# Patient Record
Sex: Male | Born: 1965
Health system: Southern US, Community
[De-identification: ages and names within clinical notes are randomized; demographics above are authoritative.]

## PROBLEM LIST (undated history)

## (undated) DIAGNOSIS — N200 Calculus of kidney: Secondary | ICD-10-CM

## (undated) DIAGNOSIS — J329 Chronic sinusitis, unspecified: Secondary | ICD-10-CM

---

## 2010-12-15 ENCOUNTER — Emergency Department: Payer: Self-pay | Admitting: Emergency Medicine

## 2011-03-02 ENCOUNTER — Emergency Department: Payer: Self-pay | Admitting: Internal Medicine

## 2013-01-15 ENCOUNTER — Ambulatory Visit: Payer: Self-pay | Admitting: Family Medicine

## 2015-03-05 ENCOUNTER — Other Ambulatory Visit: Payer: Self-pay | Admitting: Unknown Physician Specialty

## 2015-03-05 DIAGNOSIS — G44321 Chronic post-traumatic headache, intractable: Secondary | ICD-10-CM

## 2015-03-06 ENCOUNTER — Ambulatory Visit: Payer: Self-pay

## 2015-03-11 ENCOUNTER — Ambulatory Visit
Admission: RE | Admit: 2015-03-11 | Discharge: 2015-03-11 | Disposition: A | Discharge status: Home / Self Care | Payer: BLUE CROSS/BLUE SHIELD | Source: Ambulatory Visit | Attending: Unknown Physician Specialty | Admitting: Unknown Physician Specialty

## 2015-03-11 DIAGNOSIS — G44321 Chronic post-traumatic headache, intractable: Secondary | ICD-10-CM | POA: Insufficient documentation

## 2015-03-14 ENCOUNTER — Emergency Department
Admission: EM | Admit: 2015-03-14 | Discharge: 2015-03-15 | Disposition: A | Discharge status: Home / Self Care | Payer: BLUE CROSS/BLUE SHIELD | Attending: Emergency Medicine | Admitting: Emergency Medicine

## 2015-03-14 DIAGNOSIS — Z79899 Other long term (current) drug therapy: Secondary | ICD-10-CM | POA: Diagnosis not present

## 2015-03-14 DIAGNOSIS — N200 Calculus of kidney: Secondary | ICD-10-CM | POA: Diagnosis not present

## 2015-03-14 DIAGNOSIS — R109 Unspecified abdominal pain: Secondary | ICD-10-CM | POA: Diagnosis present

## 2015-03-14 LAB — BASIC METABOLIC PANEL
Anion gap: 11 (ref 5–15)
BUN: 16 mg/dL (ref 6–20)
CO2: 21 mmol/L — ABNORMAL LOW (ref 22–32)
Calcium: 8.7 mg/dL — ABNORMAL LOW (ref 8.9–10.3)
Chloride: 107 mmol/L (ref 101–111)
Creatinine, Ser: 1.29 mg/dL — ABNORMAL HIGH (ref 0.61–1.24)
GFR calc Af Amer: >60 mL/min (ref >60)
Glucose, Bld: 112 mg/dL — ABNORMAL HIGH (ref 65–99)
Potassium: 3.5 mmol/L (ref 3.5–5.1)
Sodium: 139 mmol/L (ref 135–145)

## 2015-03-14 LAB — CBC
HCT: 46.8 % (ref 40.0–52.0)
Hemoglobin: 15.6 g/dL (ref 13.0–18.0)
MCH: 30 pg (ref 26.0–34.0)
MCHC: 33.3 g/dL (ref 32.0–36.0)
MCV: 90 fL (ref 80.0–100.0)
Platelets: 179 10*3/uL (ref 150–440)
RBC: 5.2 MIL/uL (ref 4.40–5.90)
RDW: 13 % (ref 11.5–14.5)
WBC: 8.1 10*3/uL (ref 3.8–10.6)

## 2015-03-14 MED ORDER — SODIUM CHLORIDE 0.9 % IV SOLN: 1000.0000 mL | INTRAVENOUS | Status: DC

## 2015-03-14 MED ORDER — MORPHINE SULFATE 4 MG/ML IJ SOLN
INTRAMUSCULAR | Status: AC
  Administered 2015-03-14: 4 mg via INTRAVENOUS
  Filled 2015-03-14: qty 1

## 2015-03-14 MED ORDER — MORPHINE SULFATE 4 MG/ML IJ SOLN
4.0000 mg | Freq: Once | INTRAMUSCULAR | Status: AC
  Administered 2015-03-14: 4 mg via INTRAVENOUS

## 2015-03-14 MED ORDER — ONDANSETRON HCL 4 MG/2ML IJ SOLN
4.0000 mg | Freq: Once | INTRAMUSCULAR | Status: AC
  Administered 2015-03-14: 4 mg via INTRAVENOUS

## 2015-03-14 MED ORDER — SODIUM CHLORIDE 0.9 % IV SOLN
1000.0000 mL | Freq: Once | INTRAVENOUS | Status: AC
  Administered 2015-03-14: 1000 mL via INTRAVENOUS

## 2015-03-14 MED ORDER — ONDANSETRON HCL 4 MG/2ML IJ SOLN
INTRAMUSCULAR | Status: AC
  Administered 2015-03-14: 4 mg via INTRAVENOUS
  Filled 2015-03-14: qty 2

## 2015-03-14 NOTE — ED Notes (Signed)
Patient to ED with c/o left flank pain, started about 2 hours ago, history of same about a year and a half ago.

## 2015-03-15 ENCOUNTER — Emergency Department: Payer: BLUE CROSS/BLUE SHIELD

## 2015-03-15 LAB — URINALYSIS COMPLETE WITH MICROSCOPIC (ARMC ONLY)
BACTERIA UA: NONE SEEN
Bilirubin Urine: NEGATIVE
GLUCOSE, UA: NEGATIVE mg/dL
Leukocytes, UA: NEGATIVE
Nitrite: NEGATIVE
Protein, ur: NEGATIVE mg/dL
SPECIFIC GRAVITY, URINE: 1.011 (ref 1.005–1.030)
pH: 5 (ref 5.0–8.0)

## 2015-03-15 MED ORDER — TAMSULOSIN HCL 0.4 MG PO CAPS
0.4000 mg | ORAL_CAPSULE | Freq: Once | ORAL | Status: AC
  Administered 2015-03-15: 0.4 mg via ORAL

## 2015-03-15 MED ORDER — TAMSULOSIN HCL 0.4 MG PO CAPS
ORAL_CAPSULE | ORAL | Status: AC
  Administered 2015-03-15: 0.4 mg via ORAL
  Filled 2015-03-15: qty 1

## 2015-03-15 MED ORDER — OXYCODONE-ACETAMINOPHEN 5-325 MG PO TABS: 1.0000 | ORAL_TABLET | Freq: Four times a day (QID) | ORAL | Status: DC | PRN

## 2015-03-15 MED ORDER — TAMSULOSIN HCL 0.4 MG PO CAPS: 0.4000 mg | ORAL_CAPSULE | Freq: Every day | ORAL | Status: DC

## 2015-03-15 NOTE — Discharge Instructions (Signed)

## 2015-03-15 NOTE — ED Provider Notes (Signed)
[facility] Emergency Department Provider Note  ____________________________________________  Time seen: Approximately 11:45 PM  I have reviewed the triage vital signs and the nursing notes.   HISTORY  Chief Complaint Flank Pain    HPI Jackson Matthews is a 49 y.o. male with a history of kidney zones who presents with sudden onset pain 2 hours ago in his left lumbar region. Has associated nausea with dry heaves. No bloody urine. No pain in the penis or testicles. Says feels exactly like past kidney stones pain was 10 out of 10 and sharp. Pain now improved and feeling relaxed with morphine.No burning with urination. No diarrhea. Has never required surgical or procedural treatment of kidney stones in the past.   Past history of kidney stones  There are no active problems to display for this patient.   No past surgical history on file.  Current Outpatient Rx  Name  Route  Sig  Dispense  Refill  . montelukast (SINGULAIR) 10 MG tablet   Oral   Take 10 mg by mouth daily.           Allergies Review of patient's allergies indicates no known allergies.  No family history on file.  Social History History  Substance Use Topics  . Smoking status: Not on file  . Smokeless tobacco: Not on file  . Alcohol Use: Not on file    Review of Systems Constitutional: No fever/chills Eyes: No visual changes. ENT: No sore throat. Cardiovascular: Denies chest pain. Respiratory: Denies shortness of breath. Gastrointestinal: No abdominal pain.  No diarrhea.  No constipation. Genitourinary: Negative for dysuria. Musculoskeletal: As above  Skin: Negative for rash. Neurological: Negative for headaches, focal weakness or numbness.  10-point ROS otherwise negative.  ____________________________________________   PHYSICAL EXAM:  VITAL SIGNS: ED Triage Vitals  Enc Vitals Group     BP 03/14/15 2304 114/88 mmHg     Pulse Rate 03/14/15 2304 66     Resp  03/14/15 2304 22     Temp 03/14/15 2304 97.8 F (36.6 C)     Temp Source 03/14/15 2304 Oral     SpO2 03/14/15 2304 97 %     Weight 03/14/15 2304 220 lb (99.791 kg)     Height 03/14/15 2304 6' (1.829 m)     Head Cir --      Peak Flow --      Pain Score 03/14/15 2304 10     Pain Loc --      Pain Edu? --      Excl. in GC? --     Constitutional: Alert and oriented. Well appearing and in no acute distress. Eyes: Conjunctivae are normal. PERRL. EOMI. Head: Atraumatic. Nose: No congestion/rhinnorhea. Mouth/Throat: Mucous membranes are moist.  Oropharynx non-erythematous. Neck: No stridor.   Cardiovascular: Normal rate, regular rhythm. Grossly normal heart sounds.  Good peripheral circulation. Respiratory: Normal respiratory effort.  No retractions. Lungs CTAB. Gastrointestinal: Soft and nontender. No distention. No abdominal bruits. No CVA tenderness. Musculoskeletal: No lower extremity tenderness nor edema.  No joint effusions. Neurologic:  Normal speech and language. No gross focal neurologic deficits are appreciated. Speech is normal. No gait instability. Skin:  Skin is warm, dry and intact. No rash noted. Psychiatric: Mood and affect are normal. Speech and behavior are normal.  ____________________________________________   LABS (all labs ordered are listed, but only abnormal results are displayed)  Labs Reviewed  BASIC METABOLIC PANEL - Abnormal; Notable for the following:    CO2 21 (*)  Glucose, Bld 112 (*)    Creatinine, Ser 1.29 (*)    Calcium 8.7 (*)    All other components within normal limits  URINALYSIS COMPLETEWITH MICROSCOPIC ([facility] ONLY) - Abnormal; Notable for the following:    Color, Urine YELLOW (*)    APPearance CLEAR (*)    Ketones, ur TRACE (*)    Hgb urine dipstick 3+ (*)    Squamous Epithelial / LPF 0-5 (*)    All other components within normal limits  CBC    ____________________________________________  EKG   ____________________________________________  RADIOLOGY  No hydronephrosis. Chronic left greater than right benign renal cysts. ____________________________________________   PROCEDURES    ____________________________________________   INITIAL IMPRESSION / ASSESSMENT AND PLAN / ED COURSE  Pertinent labs & imaging results that were available during my care of the patient were reviewed by me and considered in my medical decision making (see chart for details).  Likely kidney stone  ----------------------------------------- 3:43 AM on 03/15/2015 -----------------------------------------  Patient resting comfortably after pain medication. We'll discharge to home with Flomax, Percocet and strainer. Follow-up with known urologist Dr. Artis Flock. No stone seen on imaging but presentation and urine consistent with this. ____________________________________________   FINAL CLINICAL IMPRESSION(S) / ED DIAGNOSES  Acute left-sided kidney stone. Initial visit.    Myrna Blazer, MD 03/15/15 [id number]

## 2015-03-15 NOTE — ED Notes (Signed)
Patient at US

## 2015-07-31 ENCOUNTER — Emergency Department
Admission: EM | Admit: 2015-07-31 | Discharge: 2015-07-31 | Disposition: A | Discharge status: Home / Self Care | Payer: Worker's Compensation | Attending: Emergency Medicine | Admitting: Emergency Medicine

## 2015-07-31 ENCOUNTER — Encounter: Payer: Self-pay | Admitting: Emergency Medicine

## 2015-07-31 ENCOUNTER — Emergency Department: Payer: Worker's Compensation

## 2015-07-31 DIAGNOSIS — Z79899 Other long term (current) drug therapy: Secondary | ICD-10-CM | POA: Insufficient documentation

## 2015-07-31 DIAGNOSIS — Y9289 Other specified places as the place of occurrence of the external cause: Secondary | ICD-10-CM | POA: Insufficient documentation

## 2015-07-31 DIAGNOSIS — Y9389 Activity, other specified: Secondary | ICD-10-CM | POA: Insufficient documentation

## 2015-07-31 DIAGNOSIS — R110 Nausea: Secondary | ICD-10-CM | POA: Diagnosis not present

## 2015-07-31 DIAGNOSIS — S62630A Displaced fracture of distal phalanx of right index finger, initial encounter for closed fracture: Secondary | ICD-10-CM | POA: Insufficient documentation

## 2015-07-31 DIAGNOSIS — S61200A Unspecified open wound of right index finger without damage to nail, initial encounter: Secondary | ICD-10-CM | POA: Insufficient documentation

## 2015-07-31 DIAGNOSIS — W231XXA Caught, crushed, jammed, or pinched between stationary objects, initial encounter: Secondary | ICD-10-CM | POA: Insufficient documentation

## 2015-07-31 DIAGNOSIS — Y990 Civilian activity done for income or pay: Secondary | ICD-10-CM | POA: Diagnosis not present

## 2015-07-31 DIAGNOSIS — S62600B Fracture of unspecified phalanx of right index finger, initial encounter for open fracture: Secondary | ICD-10-CM

## 2015-07-31 DIAGNOSIS — S6991XA Unspecified injury of right wrist, hand and finger(s), initial encounter: Secondary | ICD-10-CM | POA: Diagnosis present

## 2015-07-31 DIAGNOSIS — S61209A Unspecified open wound of unspecified finger without damage to nail, initial encounter: Secondary | ICD-10-CM

## 2015-07-31 MED ORDER — CEPHALEXIN 500 MG PO CAPS: 500.0000 mg | ORAL_CAPSULE | Freq: Four times a day (QID) | ORAL | Status: DC

## 2015-07-31 MED ORDER — BUPIVACAINE HCL 0.5 % IJ SOLN
50.0000 mL | Freq: Once | INTRAMUSCULAR | Status: DC
  Filled 2015-07-31: qty 50

## 2015-07-31 MED ORDER — CEPHALEXIN 500 MG PO CAPS
500.0000 mg | ORAL_CAPSULE | Freq: Once | ORAL | Status: AC
  Administered 2015-07-31: 500 mg via ORAL
  Filled 2015-07-31: qty 1

## 2015-07-31 MED ORDER — BUPIVACAINE HCL (PF) 0.5 % IJ SOLN
INTRAMUSCULAR | Status: AC
  Filled 2015-07-31: qty 30

## 2015-07-31 MED ORDER — OXYCODONE-ACETAMINOPHEN 5-325 MG PO TABS
1.0000 | ORAL_TABLET | Freq: Once | ORAL | Status: AC
  Administered 2015-07-31: 1 via ORAL
  Filled 2015-07-31: qty 1

## 2015-07-31 MED ORDER — OXYCODONE-ACETAMINOPHEN 5-325 MG PO TABS: 1.0000 | ORAL_TABLET | ORAL | Status: DC | PRN

## 2015-07-31 MED ORDER — ONDANSETRON 4 MG PO TBDP
4.0000 mg | ORAL_TABLET | Freq: Once | ORAL | Status: AC
Start: 2015-07-31 — End: 2015-07-31
  Administered 2015-07-31: 4 mg via ORAL
  Filled 2015-07-31: qty 1

## 2015-07-31 NOTE — ED Notes (Signed)
Pt reports a roller bounced off trailer and cut end of finger.

## 2015-07-31 NOTE — ED Notes (Signed)
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

## 2015-07-31 NOTE — Discharge Instructions (Signed)
Call Dr. Elenor LegatoHooten's office today for an appointment Monday. Elevate finger and keep it clean and dry. Percocet as needed for pain. Begin taking Keflex every 6 hours to prevent infection. Return to the emergency room over the weekend if any severe worsening of your condition.

## 2015-07-31 NOTE — ED Provider Notes (Signed)
[facility] Emergency Department Provider Note  ____________________________________________  Time seen: Approximately 9:09 AM  I have reviewed the triage vital signs and the nursing notes.   HISTORY  Chief Complaint Laceration   HPI ALLAN MINOTTI is a 49 y.o. male is here with injury to his right index finger at work. Patient states that he was unloading a truck when his index finger was caught between a piece of wood and metal. With pulling his finger out he saw blood. Patient states that it is been 4 years since his last tetanus booster. This accident occurred at work and his supervisor is here with him.Currently his pain is 10 out of 10 and is constant. He denies any allergies to medications. Patient is also having some nausea secondary to the pain.   History reviewed. No pertinent past medical history.  There are no active problems to display for this patient.   History reviewed. No pertinent past surgical history.  Current Outpatient Rx  Name  Route  Sig  Dispense  Refill  . cephALEXin (KEFLEX) 500 MG capsule   Oral   Take 1 capsule (500 mg total) by mouth 4 (four) times daily.   28 capsule   0   . montelukast (SINGULAIR) 10 MG tablet   Oral   Take 10 mg by mouth daily.         Marland Kitchen oxyCODONE-acetaminophen (PERCOCET) 5-325 MG tablet   Oral   Take 1-2 tablets by mouth every 4 (four) hours as needed for severe pain.   30 tablet   0   . tamsulosin (FLOMAX) 0.4 MG CAPS capsule   Oral   Take 1 capsule (0.4 mg total) by mouth daily.   30 capsule   0     Allergies Review of patient's allergies indicates no known allergies.  History reviewed. No pertinent family history.  Social History Social History  Substance Use Topics  . Smoking status: Never Smoker   . Smokeless tobacco: None  . Alcohol Use: Yes    Review of Systems Constitutional: No fever/chills Eyes: No visual changes. Cardiovascular: Denies chest pain. Respiratory:  Denies shortness of breath. Gastrointestinal:  Positive nausea, no vomiting.  Musculoskeletal: Negative for back pain. Skin: Negative for rash. Neurological: Negative for headaches, focal weakness or numbness.  10-point ROS otherwise negative.  ____________________________________________   PHYSICAL EXAM:  VITAL SIGNS: ED Triage Vitals  Enc Vitals Group     BP 07/31/15 0859 133/78 mmHg     Pulse Rate 07/31/15 0859 90     Resp 07/31/15 0859 18     Temp 07/31/15 0859 98.5 F (36.9 C)     Temp Source 07/31/15 0859 Oral     SpO2 07/31/15 0859 96 %     Weight 07/31/15 0859 213 lb (96.616 kg)     Height 07/31/15 0859 6' (1.829 m)     Head Cir --      Peak Flow --      Pain Score --      Pain Loc --      Pain Edu? --      Excl. in GC? --     Constitutional: Alert and oriented. Well appearing and in no acute distress. Eyes: Conjunctivae are normal. PERRL. EOMI. Head: Atraumatic. Nose: No congestion/rhinnorhea. Neck: No stridor.   Cardiovascular: Normal rate, regular rhythm. Grossly normal heart sounds.  Good peripheral circulation. Respiratory: Normal respiratory effort.  No retractions. Lungs CTAB. Gastrointestinal: Soft and nontender. No distention. Musculoskeletal:  Right  index finger. Distal partial skin amputation. Nail remains intact and no active bleeding was noted. There is moderate amount of skin tissue on the volar aspect of the distal index finger avulsed. Range of motion of the index finger is present for flexion and extension. Neurologic:  Normal speech and language. No gross focal neurologic deficits are appreciated. No gait instability. Skin:  Skin is warm, dry and intact. Skin avulsion as noted above. Psychiatric: Mood and affect are normal. Speech and behavior are normal.  ____________________________________________   LABS (all labs ordered are listed, but only abnormal results are displayed)  Labs Reviewed - No data to display RADIOLOGY  Right index  finger x-ray shows soft tissue laceration distal tip of the second digit with a underlying comminuted fracture of the tuft. ____________________________________________   PROCEDURES  Procedure(s) performed: Digital block with 0.5 Marcaine. Digit was soaked in half Betadine and half normal saline. A nonadhering dressing was placed. Metal splint was applied for protection.  Critical Care performed: No  ____________________________________________   INITIAL IMPRESSION / ASSESSMENT AND PLAN / ED COURSE  Pertinent labs & imaging results that were available during my care of the patient were reviewed by me and considered in my medical decision making (see chart for details).  Discussed with Dr. Ernest PineHooten the plan of care for this patient. Patient is to be seen on Monday he is to call the office and make an appointment. He was placed on Keflex 500 mg 4 times a day for 7 days along with prescription for Percocet one or 2 every 4 hours as needed for pain. Patient is keep the dressing clean and dry. He is return to the emergency room over the weekend if any urgent problems. ____________________________________________   FINAL CLINICAL IMPRESSION(S) / ED DIAGNOSES  Final diagnoses:  Fracture of phalanx of right index finger, open, initial encounter  Avulsion of skin of finger, initial encounter      Tommi RumpsRhonda L Summers, PA-C 07/31/15 1435  Phineas SemenGraydon Goodman, MD 07/31/15 1453

## 2015-07-31 NOTE — ED Notes (Signed)
Right index finger undressed   Avulsion/degloving noted to tip of finger

## 2015-08-05 ENCOUNTER — Encounter: Payer: Self-pay | Admitting: *Deleted

## 2015-08-05 ENCOUNTER — Ambulatory Visit
Admission: RE | Admit: 2015-08-05 | Discharge: 2015-08-05 | Disposition: A | Discharge status: Home / Self Care | Payer: Worker's Compensation | Source: Ambulatory Visit | Attending: Orthopedic Surgery | Admitting: Orthopedic Surgery

## 2015-08-05 ENCOUNTER — Encounter
Admission: RE | Disposition: A | Discharge status: Home / Self Care | Payer: Self-pay | Source: Ambulatory Visit | Attending: Orthopedic Surgery

## 2015-08-05 ENCOUNTER — Ambulatory Visit: Payer: Worker's Compensation | Admitting: Anesthesiology

## 2015-08-05 DIAGNOSIS — S68620A Partial traumatic transphalangeal amputation of right index finger, initial encounter: Secondary | ICD-10-CM | POA: Diagnosis not present

## 2015-08-05 DIAGNOSIS — W230XXA Caught, crushed, jammed, or pinched between moving objects, initial encounter: Secondary | ICD-10-CM | POA: Diagnosis not present

## 2015-08-05 DIAGNOSIS — Y999 Unspecified external cause status: Secondary | ICD-10-CM | POA: Diagnosis not present

## 2015-08-05 DIAGNOSIS — Y9389 Activity, other specified: Secondary | ICD-10-CM | POA: Insufficient documentation

## 2015-08-05 DIAGNOSIS — Y929 Unspecified place or not applicable: Secondary | ICD-10-CM | POA: Insufficient documentation

## 2015-08-05 DIAGNOSIS — Z79899 Other long term (current) drug therapy: Secondary | ICD-10-CM | POA: Diagnosis not present

## 2015-08-05 DIAGNOSIS — Z9852 Vasectomy status: Secondary | ICD-10-CM | POA: Diagnosis not present

## 2015-08-05 DIAGNOSIS — Y939 Activity, unspecified: Secondary | ICD-10-CM | POA: Insufficient documentation

## 2015-08-05 HISTORY — PX: EXCISION METACARPAL MASS: SHX6372

## 2015-08-05 SURGERY — EXCISION METACARPAL MASS
Anesthesia: General | Laterality: Right | Wound class: Clean

## 2015-08-05 MED ORDER — LIDOCAINE HCL (CARDIAC) 20 MG/ML IV SOLN
INTRAVENOUS | Status: DC | PRN
  Administered 2015-08-05: 100 mg via INTRAVENOUS

## 2015-08-05 MED ORDER — ONDANSETRON HCL 4 MG/2ML IJ SOLN: 4.0000 mg | Freq: Once | INTRAMUSCULAR | Status: DC | PRN

## 2015-08-05 MED ORDER — BUPIVACAINE HCL (PF) 0.5 % IJ SOLN
INTRAMUSCULAR | Status: AC
  Filled 2015-08-05: qty 30

## 2015-08-05 MED ORDER — FENTANYL CITRATE (PF) 100 MCG/2ML IJ SOLN
INTRAMUSCULAR | Status: DC | PRN
  Administered 2015-08-05 (×4): 25 ug via INTRAVENOUS

## 2015-08-05 MED ORDER — NEOMYCIN-POLYMYXIN B GU 40-200000 IR SOLN
Status: AC
  Filled 2015-08-05: qty 2

## 2015-08-05 MED ORDER — LACTATED RINGERS IV SOLN
INTRAVENOUS | Status: DC
  Administered 2015-08-05: 10:00:00 via INTRAVENOUS

## 2015-08-05 MED ORDER — FENTANYL CITRATE (PF) 100 MCG/2ML IJ SOLN: 25.0000 ug | INTRAMUSCULAR | Status: DC | PRN

## 2015-08-05 MED ORDER — MIDAZOLAM HCL 2 MG/2ML IJ SOLN
INTRAMUSCULAR | Status: DC | PRN
  Administered 2015-08-05: 2 mg via INTRAVENOUS

## 2015-08-05 MED ORDER — NEOMYCIN-POLYMYXIN B GU 40-200000 IR SOLN
Status: DC | PRN
  Administered 2015-08-05: 2 mL

## 2015-08-05 MED ORDER — BUPIVACAINE HCL (PF) 0.5 % IJ SOLN
INTRAMUSCULAR | Status: DC | PRN
  Administered 2015-08-05: 20 mL

## 2015-08-05 MED ORDER — CEFAZOLIN SODIUM-DEXTROSE 2-3 GM-% IV SOLR
INTRAVENOUS | Status: AC
  Administered 2015-08-05: 2 g via INTRAVENOUS
  Filled 2015-08-05: qty 50

## 2015-08-05 MED ORDER — PROPOFOL 10 MG/ML IV BOLUS
INTRAVENOUS | Status: DC | PRN
  Administered 2015-08-05: 200 mg via INTRAVENOUS

## 2015-08-05 SURGICAL SUPPLY — 20 items
BNDG ESMARK 4X12 TAN STRL LF (GAUZE/BANDAGES/DRESSINGS) ×3 IMPLANT
CANISTER SUCT 1200ML W/VALVE (MISCELLANEOUS) ×3 IMPLANT
GAUZE PETRO XEROFOAM 1X8 (MISCELLANEOUS) ×3 IMPLANT
GAUZE SPONGE 4X4 12PLY STRL (GAUZE/BANDAGES/DRESSINGS) ×3 IMPLANT
GAUZE STRETCH 2X75IN STRL (MISCELLANEOUS) ×3 IMPLANT
GLOVE BIOGEL PI IND STRL 9 (GLOVE) ×1 IMPLANT
GLOVE BIOGEL PI INDICATOR 9 (GLOVE) ×2
GLOVE SURG ORTHO 9.0 STRL STRW (GLOVE) ×3 IMPLANT
GOWN SPECIALTY ULTRA XL (MISCELLANEOUS) ×3 IMPLANT
GOWN STRL REUS W/ TWL LRG LVL3 (GOWN DISPOSABLE) ×1 IMPLANT
GOWN STRL REUS W/TWL LRG LVL3 (GOWN DISPOSABLE) ×2
KIT RM TURNOVER STRD PROC AR (KITS) ×3 IMPLANT
NEEDLE HYPO 25X1 1.5 SAFETY (NEEDLE) ×3 IMPLANT
NS IRRIG 500ML POUR BTL (IV SOLUTION) ×3 IMPLANT
PACK EXTREMITY ARMC (MISCELLANEOUS) ×3 IMPLANT
PAD GROUND ADULT SPLIT (MISCELLANEOUS) ×3 IMPLANT
STOCKINETTE STRL 6IN 960660 (GAUZE/BANDAGES/DRESSINGS) ×3 IMPLANT
SUT ETHILON 4-0 (SUTURE) ×2
SUT ETHILON 4-0 FS2 18XMFL BLK (SUTURE) ×1
SUTURE ETHLN 4-0 FS2 18XMF BLK (SUTURE) ×1 IMPLANT

## 2015-08-05 NOTE — Discharge Instructions (Signed)
Leave dressing in place until return visit.AMBULATORY SURGERY  DISCHARGE INSTRUCTIONS   1) The drugs that you were given will stay in your system until tomorrow so for the next 24 hours you should not:  A) Drive an automobile B) Make any legal decisions C) Drink any alcoholic beverage   2) You may resume regular meals tomorrow.  Today it is better to start with liquids and gradually work up to solid foods.  You may eat anything you prefer, but it is better to start with liquids, then soup and crackers, and gradually work up to solid foods.   3) Please notify your doctor immediately if you have any unusual bleeding, trouble breathing, redness and pain at the surgery site, drainage, fever, or pain not relieved by medication.    4) Additional Instructions:        Please contact your physician with any problems or Same Day Surgery at [phone], Monday through Friday 6 am to 4 pm, or  at [facility] Main number at [phone].

## 2015-08-05 NOTE — Anesthesia Procedure Notes (Signed)
Procedure Name: LMA Insertion Date/Time: 08/05/2015 11:41 AM Performed by: Junious SilkNOLES, Jalila Goodnough Pre-anesthesia Checklist: Patient identified, Patient being monitored, Timeout performed, Emergency Drugs available and Suction available Patient Re-evaluated:Patient Re-evaluated prior to inductionOxygen Delivery Method: Circle system utilized Preoxygenation: Pre-oxygenation with 100% oxygen Intubation Type: IV induction Ventilation: Mask ventilation without difficulty LMA: LMA inserted LMA Size: 4.5 Tube type: Oral Number of attempts: 1 Placement Confirmation: positive ETCO2 and breath sounds checked- equal and bilateral Tube secured with: Tape Dental Injury: Teeth and Oropharynx as per pre-operative assessment

## 2015-08-05 NOTE — Anesthesia Preprocedure Evaluation (Signed)
Anesthesia Evaluation  Patient identified by MRN, date of birth, ID band  Reviewed: Allergy & Precautions, NPO status , Patient's Chart, lab work & pertinent test results  History of Anesthesia Complications Negative for: history of anesthetic complications  Airway Mallampati: III       Dental  (+) Teeth Intact   Pulmonary neg pulmonary ROS,           Cardiovascular negative cardio ROS       Neuro/Psych negative neurological ROS     GI/Hepatic negative GI ROS, Neg liver ROS,   Endo/Other  negative endocrine ROS  Renal/GU negative Renal ROS     Musculoskeletal   Abdominal   Peds  Hematology negative hematology ROS (+)   Anesthesia Other Findings   Reproductive/Obstetrics                             Anesthesia Physical Anesthesia Plan  ASA: II  Anesthesia Plan: General   Post-op Pain Management:    Induction: Intravenous  Airway Management Planned: LMA  Additional Equipment:   Intra-op Plan:   Post-operative Plan:   Informed Consent: I have reviewed the patients History and Physical, chart, labs and discussed the procedure including the risks, benefits and alternatives for the proposed anesthesia with the patient or authorized representative who has indicated his/her understanding and acceptance.     Plan Discussed with:   Anesthesia Plan Comments:         Anesthesia Quick Evaluation

## 2015-08-05 NOTE — H&P (Signed)
Reviewed paper H+P, will be scanned into chart. No changes noted.  

## 2015-08-05 NOTE — Transfer of Care (Signed)
Immediate Anesthesia Transfer of Care Note  Patient: Cammie McgeeMilford R Rhee  Procedure(s) Performed: Procedure(s): revision right index finger amputation with closure (Right)  Patient Location: PACU  Anesthesia Type:General  Level of Consciousness: sedated  Airway & Oxygen Therapy: Patient Spontanous Breathing and Patient connected to face mask oxygen  Post-op Assessment: Report given to RN and Post -op Vital signs reviewed and stable  Post vital signs: Reviewed and stable  Last Vitals:  Filed Vitals:   08/05/15 1204  BP: 130/74  Pulse: 73  Temp: 36.1 C  Resp:     Complications: No apparent anesthesia complications

## 2015-08-05 NOTE — Anesthesia Postprocedure Evaluation (Signed)
  Anesthesia Post-op Note  Patient: Jackson Matthews  Procedure(s) Performed: Procedure(s): revision right index finger amputation with closure (Right)  Anesthesia type:General  Patient location: PACU  Post pain: Pain level controlled  Post assessment: Post-op Vital signs reviewed, Patient's Cardiovascular Status Stable, Respiratory Function Stable, Patent Airway and No signs of Nausea or vomiting  Post vital signs: Reviewed and stable  Last Vitals:  Filed Vitals:   08/05/15 1258  BP: 118/83  Pulse: 68  Temp:   Resp:     Level of consciousness: awake, alert  and patient cooperative  Complications: No apparent anesthesia complications

## 2015-08-05 NOTE — Op Note (Signed)
08/05/2015  12:08 PM  PATIENT:  Jackson Matthews  49 y.o. male  PRE-OPERATIVE DIAGNOSIS:  traumatic amputation right index finger  POST-OPERATIVE DIAGNOSIS:  traumatic amputation right index finger  PROCEDURE:  Procedure(s): revision right index finger amputation with closure (Right)  SURGEON: Leitha SchullerMichael J Debraann Livingstone, MD  ASSISTANTS: None  ANESTHESIA:   spinal  EBL:  Total I/O In: 500 [I.V.:500] Out: -   BLOOD ADMINISTERED:none  DRAINS: none   LOCAL MEDICATIONS USED:  MARCAINE     SPECIMEN:  No Specimen  DISPOSITION OF SPECIMEN:  N/A  COUNTS:  YES  TOURNIQUET:  5 minutes at 2 50 mmHg  IMPLANTS: None  DICTATION: .Dragon Dictation patient brought the operating room and after adequate anesthesia was obtained, the right arm was prepped and draped in sterile fashion. After patient identification and timeout procedures were completed tourniquet was raised to her 50 murmurs mercury. The amputation was then approached with a transverse incision of the distal tip dorsally cutting to nailbed onto the bone. The tuft fracture was removed at this time. The bone was then shortened to allow for complete soft tissue coverage of the bone. The wound was then thoroughly irrigated skin edges were debrided with sharp excision of devitalized tissue with a scalpel skin and subcutaneous tissue. Next the wound was closed with simple interrupted 4-0 nylon loosely. Prior to the start of the case after the timeout procedure and prior to tourniquet inflation 20 cc half percent Sensorcaine without epinephrine were infiltrated as a digital block to give pain relief postop. Xeroform Telfa 4 x 4 and then one-inch Kling were placed around the finger for postoperative dressing. The tourniquet was let down prior to wound closure and hemostasis checked electrocautery   PLAN OF CARE: Discharge to home after PACU  PATIENT DISPOSITION:  PACU - hemodynamically stable.

## 2017-01-29 ENCOUNTER — Emergency Department
Admission: EM | Admit: 2017-01-29 | Discharge: 2017-01-29 | Disposition: A | Discharge status: Home / Self Care | Payer: BLUE CROSS/BLUE SHIELD | Attending: Emergency Medicine | Admitting: Emergency Medicine

## 2017-01-29 ENCOUNTER — Emergency Department: Payer: BLUE CROSS/BLUE SHIELD

## 2017-01-29 ENCOUNTER — Encounter: Payer: Self-pay | Admitting: Emergency Medicine

## 2017-01-29 DIAGNOSIS — Z79899 Other long term (current) drug therapy: Secondary | ICD-10-CM | POA: Insufficient documentation

## 2017-01-29 DIAGNOSIS — R109 Unspecified abdominal pain: Secondary | ICD-10-CM | POA: Diagnosis present

## 2017-01-29 DIAGNOSIS — N200 Calculus of kidney: Secondary | ICD-10-CM

## 2017-01-29 LAB — URINALYSIS, COMPLETE (UACMP) WITH MICROSCOPIC
Bacteria, UA: NONE SEEN
Bilirubin Urine: NEGATIVE
GLUCOSE, UA: NEGATIVE mg/dL
Ketones, ur: NEGATIVE mg/dL
Leukocytes, UA: NEGATIVE
NITRITE: NEGATIVE
PROTEIN: NEGATIVE mg/dL
Specific Gravity, Urine: 1.017 (ref 1.005–1.030)
Squamous Epithelial / LPF: NONE SEEN
pH: 5 (ref 5.0–8.0)

## 2017-01-29 LAB — CBC
HEMATOCRIT: 49.7 % (ref 40.0–52.0)
Hemoglobin: 16.9 g/dL (ref 13.0–18.0)
MCH: 30.2 pg (ref 26.0–34.0)
MCHC: 33.9 g/dL (ref 32.0–36.0)
MCV: 89.1 fL (ref 80.0–100.0)
Platelets: 158 10*3/uL (ref 150–440)
RBC: 5.57 MIL/uL (ref 4.40–5.90)
RDW: 13.8 % (ref 11.5–14.5)
WBC: 6.8 10*3/uL (ref 3.8–10.6)

## 2017-01-29 LAB — BASIC METABOLIC PANEL
Anion gap: 9 (ref 5–15)
BUN: 11 mg/dL (ref 6–20)
CHLORIDE: 104 mmol/L (ref 101–111)
CO2: 25 mmol/L (ref 22–32)
CREATININE: 1.18 mg/dL (ref 0.61–1.24)
Calcium: 8.8 mg/dL — ABNORMAL LOW (ref 8.9–10.3)
GFR calc Af Amer: >60 mL/min (ref >60)
GFR calc non Af Amer: >60 mL/min (ref >60)
GLUCOSE: 104 mg/dL — AB (ref 65–99)
POTASSIUM: 3.8 mmol/L (ref 3.5–5.1)
Sodium: 138 mmol/L (ref 135–145)

## 2017-01-29 MED ORDER — KETOROLAC TROMETHAMINE 30 MG/ML IJ SOLN
30.0000 mg | Freq: Once | INTRAMUSCULAR | Status: AC
  Administered 2017-01-29: 30 mg via INTRAVENOUS

## 2017-01-29 MED ORDER — ONDANSETRON HCL 4 MG/2ML IJ SOLN
4.0000 mg | Freq: Once | INTRAMUSCULAR | Status: AC | PRN
  Administered 2017-01-29: 4 mg via INTRAVENOUS
  Filled 2017-01-29: qty 2

## 2017-01-29 MED ORDER — TAMSULOSIN HCL 0.4 MG PO CAPS: 0.4000 mg | ORAL_CAPSULE | Freq: Every day | ORAL | 0 refills | Status: DC

## 2017-01-29 MED ORDER — SODIUM CHLORIDE 0.9 % IV BOLUS (SEPSIS)
1000.0000 mL | Freq: Once | INTRAVENOUS | Status: AC
  Administered 2017-01-29: 1000 mL via INTRAVENOUS

## 2017-01-29 MED ORDER — OXYCODONE-ACETAMINOPHEN 5-325 MG PO TABS: 1.0000 | ORAL_TABLET | Freq: Four times a day (QID) | ORAL | 0 refills | Status: DC | PRN

## 2017-01-29 MED ORDER — KETOROLAC TROMETHAMINE 30 MG/ML IJ SOLN
INTRAMUSCULAR | Status: AC
  Administered 2017-01-29: 30 mg via INTRAVENOUS
  Filled 2017-01-29: qty 1

## 2017-01-29 MED ORDER — FENTANYL CITRATE (PF) 100 MCG/2ML IJ SOLN
50.0000 ug | INTRAMUSCULAR | Status: AC | PRN
  Administered 2017-01-29 (×2): 50 ug via INTRAVENOUS
  Filled 2017-01-29: qty 2

## 2017-01-29 NOTE — ED Provider Notes (Signed)
[facility] Emergency Department Provider Note  ____________________________________________   First MD Initiated Contact with Patient 01/29/17 1755     (approximate)  I have reviewed the triage vital signs and the nursing notes.   HISTORY  Chief Complaint Flank Pain   HPI Jackson Matthews is a 51 y.o. male with a history of kidney stones was presenting to the emergency department with left flank pain. He says that the pain started yesterday all of a sudden with increased to what he describes as a 100 out of 10 about an hour and a half ago. He says the pain is to his left flank and radiating into his pelvis and testicles. He says it is similar to a stone pain that he had about 2 years ago but worse. Says that he has passed his stone spontaneously and has never required a stent or lithotripsy. He says that he is also been nauseous. Tried oxycodone at home and had a dose of fentanyl prior to my interview with him in the emergency department and is still having severe pain.   Past Medical History:  Diagnosis Date  . Medical history non-contributory     There are no active problems to display for this patient.   Past Surgical History:  Procedure Laterality Date  . 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 NO PAST SURGERIES      Prior to Admission medications   Medication Sig Start Date End Date Taking? Authorizing Provider  cephALEXin (KEFLEX) 500 MG capsule Take 1 capsule (500 mg total) by mouth 4 (four) times daily. 07/31/15   Tommi RumpsSummers, Rhonda L, PA-C  montelukast (SINGULAIR) 10 MG tablet Take 10 mg by mouth daily.    [provider]  oxyCODONE-acetaminophen (PERCOCET) 5-325 MG tablet Take 1-2 tablets by mouth every 4 (four) hours as needed for severe pain. 07/31/15   Tommi RumpsSummers, Rhonda L,  PA-C  tamsulosin (FLOMAX) 0.4 MG CAPS capsule Take 1 capsule (0.4 mg total) by mouth daily. 03/15/15   Kristoffer Bala, Myra Rudeavid Matthew, MD    Allergies Patient has no known allergies.  History reviewed. No pertinent family history.  Social History Social History  Substance Use Topics  . Smoking status: Never Smoker  . Smokeless tobacco: Never Used  . Alcohol use Yes     Comment: occ.    Review of Systems  Constitutional: No fever/chills Eyes: No visual changes. ENT: No sore throat. Cardiovascular: Denies chest pain. Respiratory: Denies shortness of breath. Gastrointestinal: No abdominal pain.  No nausea, no vomiting.  No diarrhea.  No constipation. Genitourinary: Negative for dysuria. Musculoskeletal:As above Skin: Negative for rash. Neurological: Negative for headaches, focal weakness or numbness.   ____________________________________________   PHYSICAL EXAM:  VITAL SIGNS: ED Triage Vitals  Enc Vitals Group     BP 01/29/17 1653 112/73     Pulse Rate 01/29/17 1653 90     Resp 01/29/17 1653 16     Temp 01/29/17 1653 97.8 F (36.6 C)     Temp Source 01/29/17 1653 Oral     SpO2 01/29/17 1653 98 %     Weight 01/29/17 1654 223 lb (101.2 kg)     Height 01/29/17 1654 6' (1.829 m)     Head Circumference --      Peak Flow --      Pain Score 01/29/17 1658 10  Pain Loc --      Pain Edu? --      Excl. in GC? --     Constitutional: Alert and oriented. Appears uncomfortable.  Eyes: Conjunctivae are normal. PERRL. EOMI. Head: Atraumatic. Nose: No congestion/rhinnorhea. Mouth/Throat: Mucous membranes are moist.   Neck: No stridor.   Cardiovascular: Normal rate, regular rhythm. Grossly normal heart sounds.   Respiratory: Normal respiratory effort.  No retractions. Lungs CTAB. Gastrointestinal: Soft and nontender. No distention. No abdominal bruits. Left sided cva ttp Musculoskeletal: No lower extremity tenderness nor edema.  No joint effusions. Neurologic:  Normal speech  and language. No gross focal neurologic deficits are appreciated.  Skin:  Skin is warm, dry and intact. No rash noted. Psychiatric: Mood and affect are normal. Speech and behavior are normal.  ____________________________________________   LABS (all labs ordered are listed, but only abnormal results are displayed)  Labs Reviewed  URINALYSIS, COMPLETE (UACMP) WITH MICROSCOPIC - Abnormal; Notable for the following:       Result Value   Color, Urine YELLOW (*)    APPearance HAZY (*)    Hgb urine dipstick LARGE (*)    All other components within normal limits  BASIC METABOLIC PANEL - Abnormal; Notable for the following:    Glucose, Bld 104 (*)    Calcium 8.8 (*)    All other components within normal limits  CBC   ____________________________________________  EKG   ____________________________________________  RADIOLOGY  US Renal (Final result)  Result time 01/29/17 19:23:14  Final result by Waldo Laine, MD (01/29/17 19:23:14)           Narrative:   CLINICAL DATA: Left flank pain for 1 day.  EXAM: RENAL / URINARY TRACT ULTRASOUND COMPLETE  COMPARISON: CT scan March 02, 2011  FINDINGS: Right Kidney:  Length: 11.5 cm. Nonobstructive stones, measuring up to 4.8 mm, on the right. Right renal cysts identified.  Left Kidney:  Length: 13.5 cm. Nonobstructing stones measuring up to 6.7 mm on the left. Multiple left-sided cysts with the largest measuring 6 cm.  Bladder:  The bladder is poorly distended limiting evaluation.  IMPRESSION: Bilateral nonobstructing renal stones. No hydronephrosis. Bilateral renal cysts.   Electronically Signed By: Gerome Sam III M.D On: 01/29/2017 19:23            ____________________________________________   PROCEDURES  Procedure(s) performed:   Procedures  Critical Care performed:   ____________________________________________   INITIAL IMPRESSION / ASSESSMENT AND PLAN / ED  COURSE  Pertinent labs & imaging results that were available during my care of the patient were reviewed by me and considered in my medical decision making (see chart for details).  ----------------------------------------- 8:16 PM on 01/29/2017 -----------------------------------------  Patient without any distress at this time. Says his pain is a 5 out of 10 and tolerable. I consider this 5 out of 10 at vast improvement over the "100 out of 10" that he initially reported. I offered him a Percocet by mouth but he says that he would not like further pain medication at this time. We'll discharge the Flomax as well as Percocet. He knows to return for any worsening or concerning symptoms including worsening pain or fever. He is understanding the plan and willing to comply.      ____________________________________________   FINAL CLINICAL IMPRESSION(S) / ED DIAGNOSES  Kidney stone.    NEW MEDICATIONS STARTED DURING THIS VISIT:  New Prescriptions   No medications on file     Note:  This document was prepared using  [employer] and may include unintentional dictation errors.    Myrna Blazer, MD 01/29/17 2016

## 2017-01-29 NOTE — ED Triage Notes (Signed)
Pt arrived via POV with family from home with reports of left flank pain since yesterday, pt states he has hx of kidney stones and the pain has worsened since yesterday. Denies blood in urine. C/o nausea and vomiting.

## 2017-03-28 ENCOUNTER — Encounter: Payer: Self-pay | Admitting: *Deleted

## 2017-04-04 NOTE — Discharge Instructions (Signed)
[facility] °[facility] °ENDOSCOPIC SINUS SURGERY °[facility] ° °What is Functional Endoscopic Sinus Surgery? ° The Surgery involves making the natural openings of the sinuses larger by removing the bony partitions that separate the sinuses from the nasal cavity.  The natural sinus lining is preserved as much as possible to allow the sinuses to resume normal function after the surgery.  In some patients nasal polyps (excessively swollen lining of the sinuses) may be removed to relieve obstruction of the sinus openings.  The surgery is performed through the nose using lighted scopes, which eliminates the need for incisions on the face.  A septoplasty is a different procedure which is sometimes performed with sinus surgery.  It involves straightening the boy partition that separates the two sides of your nose.  A crooked or deviated septum may need repair if is obstructing the sinuses or nasal airflow.  Turbinate reduction is also often performed during sinus surgery.  The turbinates are bony proturberances from the side walls of the nose which swell and can obstruct the nose in patients with sinus and allergy problems.  Their size can be surgically reduced to help relieve nasal obstruction. ° °What Can Sinus Surgery Do For Me? ° Sinus surgery can reduce the frequency of sinus infections requiring antibiotic treatment.  This can provide improvement in nasal congestion, post-nasal drainage, facial pressure and nasal obstruction.  Surgery will NOT prevent you from ever having an infection again, so it usually only for patients who get infections 4 or more times yearly requiring antibiotics, or for infections that do not clear with antibiotics.  It will not cure nasal allergies, so patients with allergies may still require medication to treat their allergies after surgery. Surgery may improve headaches related to sinusitis, however, some people will continue to  require medication to control sinus headaches related to allergies.  Surgery will do nothing for other forms of headache (migraine, tension or cluster). ° °What Are the Risks of Endoscopic Sinus Surgery? ° Current techniques allow surgery to be performed safely with little risk, however, there are rare complications that patients should be aware of.  Because the sinuses are located around the eyes, there is risk of eye injury, including blindness, though again, this would be quite rare. This is usually a result of bleeding behind the eye during surgery, which puts the vision oat risk, though there are treatments to protect the vision and prevent permanent disrupted by surgery causing a leak of the spinal fluid that surrounds the brain.  More serious complications would include bleeding inside the brain cavity or damage to the brain.  Again, all of these complications are uncommon, and spinal fluid leaks can be safely managed surgically if they occur.  The most common complication of sinus surgery is bleeding from the nose, which may require packing or cauterization of the nose.  Continued sinus have polyps may experience recurrence of the polyps requiring revision surgery.  Alterations of sense of smell or injury to the tear ducts are also rare complications.  ° °What is the Surgery Like, and what is the Recovery? ° The Surgery usually takes a couple of hours to perform, and is usually performed under a general anesthetic (completely asleep).  Patients are usually discharged home after a couple of hours.  Sometimes during surgery it is necessary to pack the nose to control bleeding, and the packing is left in place for 24 - 48 hours, and removed by your surgeon.    If a septoplasty was performed during the procedure, there is often a splint placed which must be removed after 5-7 days.   °Discomfort: Pain is usually mild to moderate, and can be controlled by prescription pain medication or acetaminophen (Tylenol).   Aspirin, Ibuprofen (Advil, Motrin), or Naprosyn (Aleve) should be avoided, as they can cause increased bleeding.  Most patients feel sinus pressure like they have a bad head cold for several days.  Sleeping with your head elevated can help reduce swelling and facial pressure, as can ice packs over the face.  A humidifier may be helpful to keep the mucous and blood from drying in the nose.  ° °Diet: There are no specific diet restrictions, however, you should generally start with clear liquids and a light diet of bland foods because the anesthetic can cause some nausea.  Advance your diet depending on how your stomach feels.  Taking your pain medication with food will often help reduce stomach upset which pain medications can cause. ° °Nasal Saline Irrigation: It is important to remove blood clots and dried mucous from the nose as it is healing.  This is done by having you irrigate the nose at least 3 - 4 times daily with a salt water solution.  We recommend using NeilMed Sinus Rinse (available at the drug store).  Fill the squeeze bottle with the solution, bend over a sink, and insert the tip of the squeeze bottle into the nose ½ of an inch.  Point the tip of the squeeze bottle towards the inside corner of the eye on the same side your irrigating.  Squeeze the bottle and gently irrigate the nose.  If you bend forward as you do this, most of the fluid will flow back out of the nose, instead of down your throat.   The solution should be warm, near body temperature, when you irrigate.   Each time you irrigate, you should use a full squeeze bottle.  ° °Note that if you are instructed to use Nasal Steroid Sprays at any time after your surgery, irrigate with saline BEFORE using the steroid spray, so you do not wash it all out of the nose. °Another product, Nasal Saline Gel (such as AYR Nasal Saline Gel) can be applied in each nostril 3 - 4 times daily to moisture the nose and reduce scabbing or crusting. ° °Bleeding:   Bloody drainage from the nose can be expected for several days, and patients are instructed to irrigate their nose frequently with salt water to help remove mucous and blood clots.  The drainage may be dark red or brown, though some fresh blood may be seen intermittently, especially after irrigation.  Do not blow you nose, as bleeding may occur. If you must sneeze, keep your mouth open to allow air to escape through your mouth. ° °If heavy bleeding occurs: Irrigate the nose with saline to rinse out clots, then spray the nose 3 - 4 times with Afrin Nasal Decongestant Spray.  The spray will constrict the blood vessels to slow bleeding.  Pinch the lower half of your nose shut to apply pressure, and lay down with your head elevated.  Ice packs over the nose may help as well. If bleeding persists despite these measures, you should notify your doctor.  Do not use the Afrin routinely to control nasal congestion after surgery, as it can result in worsening congestion and may affect healing.  ° ° ° °Activity: Return to work varies among patients. Most patients will be   out of work at least 5 - 7 days to recover.  Patient may return to work after they are off of narcotic pain medication, and feeling well enough to perform the functions of their job.  Patients must avoid heavy lifting (over 10 pounds) or strenuous physical for 2 weeks after surgery, so your employer may need to assign you to light duty, or keep you out of work longer if light duty is not possible.  NOTE: you should not drive, operate dangerous machinery, do any mentally demanding tasks or make any important legal or financial decisions while on narcotic pain medication and recovering from the general anesthetic.  °  °Call Your Doctor Immediately if You Have Any of the Following: °1. Bleeding that you cannot control with the above measures °2. Loss of vision, double vision, bulging of the eye or black eyes. °3. Fever over 101 degrees °4. Neck stiffness with  severe headache, fever, nausea and change in mental state. °You are always encourage to call anytime with concerns, however, please call with requests for pain medication refills during office hours. ° °Office Endoscopy: During follow-up visits your doctor will remove any packing or splints that may have been placed and evaluate and clean your sinuses endoscopically.  Topical anesthetic will be used to make this as comfortable as possible, though you may want to take your pain medication prior to the visit.  How often this will need to be done varies from patient to patient.  After complete recovery from the surgery, you may need follow-up endoscopy from time to time, particularly if there is concern of recurrent infection or nasal polyps. ° ° °General Anesthesia, Adult, Care After °These instructions provide you with information about caring for yourself after your procedure. Your health care provider may also give you more specific instructions. Your treatment has been planned according to current medical practices, but problems sometimes occur. Call your health care provider if you have any problems or questions after your procedure. °What can I expect after the procedure? °After the procedure, it is common to have: °· Vomiting. °· A sore throat. °· Mental slowness. ° °It is common to feel: °· Nauseous. °· Cold or shivery. °· Sleepy. °· Tired. °· Sore or achy, even in parts of your body where you did not have surgery. ° °Follow these instructions at home: °For at least 24 hours after the procedure: °· Do not: °? Participate in activities where you could fall or become injured. °? Drive. °? Use heavy machinery. °? Drink alcohol. °? Take sleeping pills or medicines that cause drowsiness. °? Make important decisions or sign legal documents. °? Take care of children on your own. °· Rest. °Eating and drinking °· If you vomit, drink water, juice, or soup when you can drink without vomiting. °· Drink enough fluid to  keep your urine clear or pale yellow. °· Make sure you have little or no nausea before eating solid foods. °· Follow the diet recommended by your health care provider. °General instructions °· Have a responsible adult stay with you until you are awake and alert. °· Return to your normal activities as told by your health care provider. Ask your health care provider what activities are safe for you. °· Take over-the-counter and prescription medicines only as told by your health care provider. °· If you smoke, do not smoke without supervision. °· Keep all follow-up visits as told by your health care provider. This is important. °Contact a health care provider if: °· You   continue to have nausea or vomiting at home, and medicines are not helpful. °· You cannot drink fluids or start eating again. °· You cannot urinate after 8-12 hours. °· You develop a skin rash. °· You have fever. °· You have increasing redness at the site of your procedure. °Get help right away if: °· You have difficulty breathing. °· You have chest pain. °· You have unexpected bleeding. °· You feel that you are having a life-threatening or urgent problem. °This information is not intended to replace advice given to you by your health care provider. Make sure you discuss any questions you have with your health care provider. °Document Released: 12/20/2000 Document Revised: 02/16/2016 Document Reviewed: 08/28/2015 °Elsevier Interactive Patient Education © 2018 [employer] ° °

## 2017-04-19 ENCOUNTER — Ambulatory Visit: Payer: Self-pay | Admitting: Otolaryngology

## 2017-04-19 ENCOUNTER — Ambulatory Visit: Payer: BLUE CROSS/BLUE SHIELD | Admitting: Anesthesiology

## 2017-04-19 ENCOUNTER — Ambulatory Visit
Admission: RE | Admit: 2017-04-19 | Discharge: 2017-04-19 | Disposition: A | Discharge status: Home / Self Care | Payer: BLUE CROSS/BLUE SHIELD | Source: Ambulatory Visit | Attending: Otolaryngology | Admitting: Otolaryngology

## 2017-04-19 ENCOUNTER — Encounter
Admission: RE | Disposition: A | Discharge status: Home / Self Care | Payer: Self-pay | Source: Ambulatory Visit | Attending: Otolaryngology

## 2017-04-19 DIAGNOSIS — J339 Nasal polyp, unspecified: Secondary | ICD-10-CM | POA: Insufficient documentation

## 2017-04-19 DIAGNOSIS — J323 Chronic sphenoidal sinusitis: Secondary | ICD-10-CM | POA: Insufficient documentation

## 2017-04-19 DIAGNOSIS — J309 Allergic rhinitis, unspecified: Secondary | ICD-10-CM | POA: Diagnosis not present

## 2017-04-19 DIAGNOSIS — J320 Chronic maxillary sinusitis: Secondary | ICD-10-CM | POA: Diagnosis not present

## 2017-04-19 DIAGNOSIS — J321 Chronic frontal sinusitis: Secondary | ICD-10-CM | POA: Diagnosis not present

## 2017-04-19 DIAGNOSIS — J322 Chronic ethmoidal sinusitis: Secondary | ICD-10-CM | POA: Diagnosis not present

## 2017-04-19 HISTORY — PX: MAXILLARY ANTROSTOMY: SHX2003

## 2017-04-19 HISTORY — PX: ETHMOIDECTOMY: SHX5197

## 2017-04-19 HISTORY — PX: IMAGE GUIDED SINUS SURGERY: SHX6570

## 2017-04-19 HISTORY — PX: TURBINATE REDUCTION: SHX6157

## 2017-04-19 HISTORY — DX: Calculus of kidney: N20.0

## 2017-04-19 HISTORY — PX: FRONTAL SINUS EXPLORATION: SHX6591

## 2017-04-19 HISTORY — DX: Chronic sinusitis, unspecified: J32.9

## 2017-04-19 HISTORY — PX: POLYPECTOMY: SHX149

## 2017-04-19 SURGERY — SINUS SURGERY, WITH IMAGING GUIDANCE
Anesthesia: Monitor Anesthesia Care | Site: Nose | Wound class: Clean Contaminated

## 2017-04-19 MED ORDER — MIDAZOLAM HCL 5 MG/5ML IJ SOLN
INTRAMUSCULAR | Status: DC | PRN
  Administered 2017-04-19: 2 mg via INTRAVENOUS

## 2017-04-19 MED ORDER — ONDANSETRON HCL 4 MG/2ML IJ SOLN
INTRAMUSCULAR | Status: DC | PRN
  Administered 2017-04-19: 4 mg via INTRAVENOUS

## 2017-04-19 MED ORDER — SUCCINYLCHOLINE CHLORIDE 20 MG/ML IJ SOLN
INTRAMUSCULAR | Status: DC | PRN
  Administered 2017-04-19: 100 mg via INTRAVENOUS

## 2017-04-19 MED ORDER — LIDOCAINE-EPINEPHRINE 1 %-1:100000 IJ SOLN
INTRAMUSCULAR | Status: DC | PRN
Start: 2017-04-19 — End: 2017-04-19
  Administered 2017-04-19: 9 mL

## 2017-04-19 MED ORDER — FENTANYL CITRATE (PF) 100 MCG/2ML IJ SOLN
INTRAMUSCULAR | Status: DC | PRN
  Administered 2017-04-19 (×2): 50 ug via INTRAVENOUS

## 2017-04-19 MED ORDER — ROCURONIUM BROMIDE 100 MG/10ML IV SOLN
INTRAVENOUS | Status: DC | PRN
  Administered 2017-04-19: 30 mg via INTRAVENOUS

## 2017-04-19 MED ORDER — PROPOFOL 10 MG/ML IV BOLUS
INTRAVENOUS | Status: DC | PRN
Start: 2017-04-19 — End: 2017-04-19
  Administered 2017-04-19: 150 mg via INTRAVENOUS
  Administered 2017-04-19: 50 mg via INTRAVENOUS

## 2017-04-19 MED ORDER — ACETAMINOPHEN 10 MG/ML IV SOLN
INTRAVENOUS | Status: DC | PRN
  Administered 2017-04-19: 1000 mg via INTRAVENOUS

## 2017-04-19 MED ORDER — HYDROCODONE-ACETAMINOPHEN 5-325 MG PO TABS: 1.0000 | ORAL_TABLET | ORAL | 0 refills | Status: DC | PRN

## 2017-04-19 MED ORDER — LACTATED RINGERS IV SOLN
INTRAVENOUS | Status: DC
Start: 2017-04-19 — End: 2017-04-19
  Administered 2017-04-19: 07:00:00 via INTRAVENOUS

## 2017-04-19 MED ORDER — FENTANYL CITRATE (PF) 100 MCG/2ML IJ SOLN: 25.0000 ug | INTRAMUSCULAR | Status: DC | PRN

## 2017-04-19 MED ORDER — OXYMETAZOLINE HCL 0.05 % NA SOLN
NASAL | Status: DC | PRN
Start: 2017-04-19 — End: 2017-04-19
  Administered 2017-04-19: 1 via TOPICAL

## 2017-04-19 MED ORDER — OXYCODONE HCL 5 MG PO TABS
5.0000 mg | ORAL_TABLET | Freq: Once | ORAL | Status: AC | PRN
Start: 2017-04-19 — End: 2017-04-19
  Administered 2017-04-19: 5 mg via ORAL

## 2017-04-19 MED ORDER — OXYCODONE HCL 5 MG/5ML PO SOLN: 5.0000 mg | Freq: Once | ORAL | Status: AC | PRN

## 2017-04-19 MED ORDER — LIDOCAINE HCL (CARDIAC) 20 MG/ML IV SOLN
INTRAVENOUS | Status: DC | PRN
  Administered 2017-04-19: 50 mg via INTRAVENOUS

## 2017-04-19 MED ORDER — CEFDINIR 300 MG PO CAPS: 300.0000 mg | ORAL_CAPSULE | Freq: Two times a day (BID) | ORAL | 0 refills | Status: DC

## 2017-04-19 MED ORDER — DEXAMETHASONE SODIUM PHOSPHATE 4 MG/ML IJ SOLN
INTRAMUSCULAR | Status: DC | PRN
  Administered 2017-04-19: 8 mg via INTRAVENOUS

## 2017-04-19 MED ORDER — EPHEDRINE SULFATE 50 MG/ML IJ SOLN
INTRAMUSCULAR | Status: DC | PRN
  Administered 2017-04-19: 5 mg via INTRAVENOUS

## 2017-04-19 SURGICAL SUPPLY — 23 items
BATTERY INSTRU NAVIGATION (MISCELLANEOUS) ×6 IMPLANT
CANISTER SUCT 1200ML W/VALVE (MISCELLANEOUS) ×6 IMPLANT
COAG SUCT 10F 3.5MM HAND CTRL (MISCELLANEOUS) ×6 IMPLANT
DRAPE HEAD BAR (DRAPES) ×6 IMPLANT
DRESSING NASL FOAM PST OP SINU (MISCELLANEOUS) ×8 IMPLANT
DRSG NASAL 4CM NASOPORE (MISCELLANEOUS) IMPLANT
DRSG NASAL FOAM POST OP SINU (MISCELLANEOUS) ×12
GLOVE BIO SURGEON STRL SZ7.5 (GLOVE) ×18 IMPLANT
IV NS 500ML (IV SOLUTION) ×2
IV NS 500ML BAXH (IV SOLUTION) ×4 IMPLANT
KIT ROOM TURNOVER OR (KITS) ×6 IMPLANT
NAVIGATION MASK REG  ST (MISCELLANEOUS) ×6 IMPLANT
NS IRRIG 500ML POUR BTL (IV SOLUTION) ×6 IMPLANT
PACK DRAPE NASAL/ENT (PACKS) ×6 IMPLANT
PAD GROUND ADULT SPLIT (MISCELLANEOUS) ×6 IMPLANT
PATTIES SURGICAL .5 X3 (DISPOSABLE) ×6 IMPLANT
SHAVER DIEGO BLD STD TYPE A (BLADE) ×12 IMPLANT
SOL ANTI-FOG 6CC FOG-OUT (MISCELLANEOUS) ×4 IMPLANT
SOL FOG-OUT ANTI-FOG 6CC (MISCELLANEOUS) ×2
SYRINGE 10CC LL (SYRINGE) ×6 IMPLANT
TUBING DECLOG MULTIDEBRIDER (TUBING) ×12 IMPLANT
WAND COBLATOR ENT REFLEX ULT45 (MISCELLANEOUS) ×6 IMPLANT
WATER STERILE IRR 250ML POUR (IV SOLUTION) ×6 IMPLANT

## 2017-04-19 NOTE — Transfer of Care (Signed)
Immediate Anesthesia Transfer of Care Note  Patient: [employer]  Procedure(s) Performed: Procedure(s) with comments: IMAGE GUIDED SINUS SURGERY (N/A) -   LEFT SPHENOIDECTOMY NOT PERFORMED POLYPS WERE BLOCKING THE OPENING AND WHEN POLYPS WERE REMOVED THE SINUS CAVITY WAS OPEN   NEED DISK GAVE DISK TO [facility] 6-6- KP POLYPECTOMY NASAL ENDOSCOPY LEFT (Left) FRONTAL SINUS EXPLORATION (Bilateral) ETHMOIDECTOMY RIGHT ANTERIOR / LEFT TOTAL ETHMOIDECTOMY (Bilateral) MAXILLARY ANTROSTOMY (Bilateral) TURBINATE COBLATION (Bilateral)  Patient Location: PACU  Anesthesia Type: MAC  Level of Consciousness: awake, alert  and patient cooperative  Airway and Oxygen Therapy: Patient Spontanous Breathing and Patient connected to supplemental oxygen  Post-op Assessment: Post-op Vital signs reviewed, Patient's Cardiovascular Status Stable, Respiratory Function Stable, Patent Airway and No signs of Nausea or vomiting  Post-op Vital Signs: Reviewed and stable  Complications: No apparent anesthesia complications

## 2017-04-19 NOTE — H&P (Signed)
History and physical reviewed and will be scanned in later. No change in medical status reported by the patient or family, appears stable for surgery. All questions regarding the procedure answered, and patient (or family if a child) expressed understanding of the procedure.  Jackson Matthews S @TODAY@ 

## 2017-04-19 NOTE — Op Note (Signed)
04/19/2017  11:06 AM    Cammie McgeeMilford R Storrs  [id number]   Pre-Op Diagnosis:  NASAL POLYPS, CHRONIC SINUSITIS (BILATERAL MAXILLARY, FRONTAL, ETHMOID, LEFT SPHENOID) CHRONIC ALLERGIC RHINITIS  Post-op Diagnosis: SAME  Procedure:  1)  Image Guided Sinus Surgery,   2)  Bilateral Endoscopic Maxillary Antrostomy with Tissue Removal   3)  Bilateral Frontal Sinusotomy with tissue removal   4)  Left Total Ethmoidectomy with polypectomy   5)  Right Anterior Ethmoidectomy    6)  Bilateral inferior turbinate Coblation reduction     Surgeon:  Sandi MealyBennett, Yvette Loveless S  Anesthesia:  General endotracheal  EBL: 200cc  Complications:  None  Findings: Polyps coming from the left superior meatus and posterior ethmoids, mucosal inflammation in the maxillary sinuses and frontal recess bilaterally with narrow ostiomeatal complex bilaterally. The left sphenoid os was widely patent once the polyps were removed, and did not require sphenoidotomy   Procedure: After the patient was identified in holding and the benefits of the procedure were reviewed as well as the consent and risks, the patient was taken to the operating room and with the patient in a comfortable supine position,  general orotracheal anesthesia was induced without difficulty.  A proper time-out was performed.  The Stryker image guidance system was set up and calibrated in the normal fashion and felt to be acceptable.  Next 1% Xylocaine with 1:100,000 epinephrine was infiltrated into the inferior turbinates and anterior middle turbinates bilaterally.  Several minutes were allowed for this to take effect.  Cottoniod pledgets soaked in Afrin were placed into both nasal cavities and left while the patient was prepped and draped in the standard fashion. The image guided suction was calibrated and used to inspect known points in the nasal cavity to assess accuracy of the image guided system. Accuracy was felt to be excellent.   The left nasal cavity was  inspected with the 0 scope, and prominent polyps were noted in the posterior nasal cavity coming from the left superior meatus. These were grasped at their base with cup forceps and debrided and sent for pathology. The left middle turbinate was medialized and the uncinate process then resected with through-cutting forceps as well as the microdebrider. In this fashion the uncinate was completely removed along with soft tissue and bone of the medial wall of the maxillary sinus to create a large patent maxillary antrostomy. The left maxillary sinus was suctioned to clear secretions.   Next the left anterior ethmoid sinuses were dissected beginning inferomedially, entering the ethmoid bulla. Thru cut forceps were used to open the anterior ethmoids. The microdebrider was used as needed to trim loose mucosal edges, widely opening the anterior ethmoid sinuses.   Next the basal lamella was entered and, working back in a sequential fashion through the ethmoid air ceLls, the ethmoid sinuses were dissected to the posterior ethmoid sinuses, utilizing the image guided suction and the whole time to reassess the anatomy frequently. Thru cutting forceps were used for this dissection. Care was taken to avoid injury to the lamina papyracea laterally and the skull base superiorly.  residual of the previously debrided polyps was resected as completely as possible. Some thick secretions were suctioned from the posterior ethmoids where they had been blocked by the polyps. Some of the thicker material was included in the specimen so it could be examined for possible fungus.   Dissection proceeded anteriorly and superiorly into the left frontal recess which was dissected utilizing a 30 and then a 70 scope and frontal  curved instruments. The curved image guided suction was used during this dissection to frequently reassess the anatomy on the CT scan. The frontal recess was dissected until a suction could be passed up into the os of  the  frontal sinus.   Next the scope was passed medial to the middle turbinate and the left sphenoid recess inspected. With the polyps now removed, there was actually a large natural sphenoid os noted with no occlusion, and the scope could be easily passed and the sphenoid sinus, just noting some mucosal thickening. There was no need for a surgical sphenoidotomy now that the polyps were cleared from the sphenoid recess.   Attention was then turned to the right side where the same procedure was performed, opening the maxillary sinus, ethmoid cavities,  and the frontal recess in the same fashion as described above,  that on this side only the anterior ethmoids were dissected. There was no significant disease in the posterior ethmoids on imaging. There were also no gross polyps on this side, just inflamed mucosal thickening.   The nose was suctioned and inspected. The maxillary sinuses were irrigated with saline. Stammberger absorbable sinus packing was then placed in the ethmoid cavities bilaterally.   The Coblator was then used to achieve reduction of the inferior turbinates bilaterally. 2 passes through each inferior turbinate were made on a setting of 4 Watts.  The patient was then returned to the anesthesiologist for awakening and taken to recovery room in good condition postoperatively.  Disposition:   PACU and d/c home  Plan: Ice, elevation, narcotic analgesia and prophylactic antibiotics. Begin sinus irrigations with saline tomrrow, irrigating 3-4 times daily. Return to the office in 7 days.  Return to work in 7-10 days, no strenuous activities for two weeks.   Sandi Mealy 04/19/2017 11:06 AM

## 2017-04-19 NOTE — Anesthesia Procedure Notes (Signed)
Procedure Name: Intubation Date/Time: 04/19/2017 8:39 AM Performed by: Londell Moh Pre-anesthesia Checklist: Patient identified, Emergency Drugs available, Suction available, Patient being monitored and Timeout performed Patient Re-evaluated:Patient Re-evaluated prior to induction Oxygen Delivery Method: Circle system utilized Preoxygenation: Pre-oxygenation with 100% oxygen Induction Type: IV induction Ventilation: Mask ventilation without difficulty Laryngoscope Size: Mac and 3 Grade View: Grade II Tube type: Oral Rae Tube size: 7.5 mm Number of attempts: 1 Airway Equipment and Method: Bougie stylet Placement Confirmation: ETT inserted through vocal cords under direct vision,  positive ETCO2 and breath sounds checked- equal and bilateral Tube secured with: Tape Dental Injury: Teeth and Oropharynx as per pre-operative assessment  Difficulty Due To: Difficulty was anticipated and Difficult Airway- due to anterior larynx

## 2017-04-19 NOTE — Anesthesia Preprocedure Evaluation (Addendum)
Anesthesia Evaluation  Patient identified by MRN, date of birth, ID band Patient awake    Reviewed: Allergy & Precautions, NPO status , Patient's Chart, lab work & pertinent test results  Airway Mallampati: II  TM Distance: >3 FB     Dental no notable dental hx.    Pulmonary neg pulmonary ROS,    Pulmonary exam normal breath sounds clear to auscultation       Cardiovascular negative cardio ROS Normal cardiovascular exam     Neuro/Psych negative neurological ROS     GI/Hepatic negative GI ROS, Neg liver ROS,   Endo/Other  negative endocrine ROS  Renal/GU   negative genitourinary   Musculoskeletal   Abdominal   Peds negative pediatric ROS (+)  Hematology negative hematology ROS (+)   Anesthesia Other Findings   Reproductive/Obstetrics                             Anesthesia Physical Anesthesia Plan  ASA: II  Anesthesia Plan: MAC   Post-op Pain Management:    Induction:   PONV Risk Score and Plan:   Airway Management Planned:   Additional Equipment:   Intra-op Plan:   Post-operative Plan:   Informed Consent: I have reviewed the patients History and Physical, chart, labs and discussed the procedure including the risks, benefits and alternatives for the proposed anesthesia with the patient or authorized representative who has indicated his/her understanding and acceptance.     Plan Discussed with:   Anesthesia Plan Comments:         Anesthesia Quick Evaluation

## 2017-04-19 NOTE — Anesthesia Postprocedure Evaluation (Signed)
Anesthesia Post Note  Patient: Jackson Matthews  Procedure(s) Performed: Procedure(s) (LRB): IMAGE GUIDED SINUS SURGERY (N/A) POLYPECTOMY NASAL ENDOSCOPY LEFT (Left) FRONTAL SINUS EXPLORATION (Bilateral) ETHMOIDECTOMY RIGHT ANTERIOR / LEFT TOTAL ETHMOIDECTOMY (Bilateral) MAXILLARY ANTROSTOMY (Bilateral) TURBINATE COBLATION (Bilateral)  Patient location during evaluation: PACU Anesthesia Type: MAC Level of consciousness: awake and alert Pain management: pain level controlled Vital Signs Assessment: post-procedure vital signs reviewed and stable Respiratory status: spontaneous breathing Cardiovascular status: blood pressure returned to baseline Postop Assessment: no headache Anesthetic complications: no    Jaci Standard, III,  Bryttany Tortorelli D

## 2017-04-20 ENCOUNTER — Encounter: Payer: Self-pay | Admitting: Otolaryngology

## 2017-04-21 LAB — SURGICAL PATHOLOGY

## 2017-05-19 ENCOUNTER — Encounter
Admission: EM | Disposition: A | Discharge status: Home / Self Care | Payer: Self-pay | Source: Home / Self Care | Attending: Emergency Medicine

## 2017-05-19 ENCOUNTER — Emergency Department
Admission: EM | Admit: 2017-05-19 | Discharge: 2017-05-19 | Disposition: A | Discharge status: Home / Self Care | Payer: 59 | Attending: Emergency Medicine | Admitting: Emergency Medicine

## 2017-05-19 ENCOUNTER — Inpatient Hospital Stay: Admit: 2017-05-19 | Payer: BLUE CROSS/BLUE SHIELD | Admitting: Otolaryngology

## 2017-05-19 ENCOUNTER — Encounter: Payer: Self-pay | Admitting: Emergency Medicine

## 2017-05-19 ENCOUNTER — Emergency Department: Payer: 59 | Admitting: Anesthesiology

## 2017-05-19 DIAGNOSIS — R040 Epistaxis: Secondary | ICD-10-CM | POA: Insufficient documentation

## 2017-05-19 DIAGNOSIS — Z87442 Personal history of urinary calculi: Secondary | ICD-10-CM | POA: Diagnosis not present

## 2017-05-19 DIAGNOSIS — R58 Hemorrhage, not elsewhere classified: Secondary | ICD-10-CM

## 2017-05-19 DIAGNOSIS — Z9889 Other specified postprocedural states: Secondary | ICD-10-CM | POA: Diagnosis not present

## 2017-05-19 HISTORY — PX: SEPTOPLASTY: SHX2393

## 2017-05-19 LAB — CBC
HCT: 43.9 % (ref 40.0–52.0)
Hemoglobin: 14.9 g/dL (ref 13.0–18.0)
MCH: 29.9 pg (ref 26.0–34.0)
MCHC: 34.1 g/dL (ref 32.0–36.0)
MCV: 87.7 fL (ref 80.0–100.0)
PLATELETS: 173 10*3/uL (ref 150–440)
RBC: 5 MIL/uL (ref 4.40–5.90)
RDW: 13.7 % (ref 11.5–14.5)
WBC: 4.7 10*3/uL (ref 3.8–10.6)

## 2017-05-19 LAB — TYPE AND SCREEN
ABO/RH(D): O POS
Antibody Screen: NEGATIVE

## 2017-05-19 LAB — HEMOGLOBIN AND HEMATOCRIT, BLOOD
HCT: 42.1 % (ref 40.0–52.0)
HEMOGLOBIN: 14.2 g/dL (ref 13.0–18.0)

## 2017-05-19 SURGERY — SEPTOPLASTY, NOSE
Anesthesia: General | Wound class: Clean Contaminated

## 2017-05-19 MED ORDER — THROMBIN 5000 UNITS EX SOLR
CUTANEOUS | Status: AC
Start: 2017-05-19 — End: ?
  Filled 2017-05-19: qty 5000

## 2017-05-19 MED ORDER — FENTANYL CITRATE (PF) 100 MCG/2ML IJ SOLN: 25.0000 ug | INTRAMUSCULAR | Status: DC | PRN

## 2017-05-19 MED ORDER — TRANEXAMIC ACID 1000 MG/10ML IV SOLN
500.0000 mg | Freq: Once | INTRAVENOUS | Status: DC
  Filled 2017-05-19: qty 10

## 2017-05-19 MED ORDER — OXYMETAZOLINE HCL 0.05 % NA SOLN: 1.0000 | Freq: Once | NASAL | Status: DC

## 2017-05-19 MED ORDER — PHENYLEPHRINE HCL 10 MG/ML IJ SOLN
INTRAMUSCULAR | Status: DC | PRN
  Administered 2017-05-19: 200 ug via INTRAVENOUS

## 2017-05-19 MED ORDER — SODIUM CHLORIDE 0.9 % IV SOLN
INTRAVENOUS | Status: DC | PRN
  Administered 2017-05-19: 10:00:00 via INTRAVENOUS

## 2017-05-19 MED ORDER — LACTATED RINGERS IV SOLN
INTRAVENOUS | Status: DC | PRN
  Administered 2017-05-19: 11:00:00 via INTRAVENOUS

## 2017-05-19 MED ORDER — OXYMETAZOLINE HCL 0.05 % NA SOLN
NASAL | Status: DC | PRN
  Administered 2017-05-19: 1

## 2017-05-19 MED ORDER — ROCURONIUM BROMIDE 100 MG/10ML IV SOLN
INTRAVENOUS | Status: DC | PRN
  Administered 2017-05-19 (×2): 10 mg via INTRAVENOUS

## 2017-05-19 MED ORDER — ONDANSETRON HCL 4 MG/2ML IJ SOLN
INTRAMUSCULAR | Status: DC | PRN
  Administered 2017-05-19: 4 mg via INTRAVENOUS

## 2017-05-19 MED ORDER — ONDANSETRON HCL 4 MG/2ML IJ SOLN
4.0000 mg | Freq: Once | INTRAMUSCULAR | Status: DC
Start: 2017-05-19 — End: 2017-05-19
  Filled 2017-05-19: qty 2

## 2017-05-19 MED ORDER — DEXAMETHASONE SODIUM PHOSPHATE 10 MG/ML IJ SOLN
INTRAMUSCULAR | Status: DC | PRN
  Administered 2017-05-19: 10 mg via INTRAVENOUS

## 2017-05-19 MED ORDER — MIDAZOLAM HCL 2 MG/2ML IJ SOLN
INTRAMUSCULAR | Status: DC | PRN
  Administered 2017-05-19: 2 mg via INTRAVENOUS

## 2017-05-19 MED ORDER — SUCCINYLCHOLINE CHLORIDE 20 MG/ML IJ SOLN
INTRAMUSCULAR | Status: DC | PRN
  Administered 2017-05-19: 100 mg via INTRAVENOUS

## 2017-05-19 MED ORDER — PROMETHAZINE HCL 25 MG/ML IJ SOLN: 6.2500 mg | INTRAMUSCULAR | Status: DC | PRN

## 2017-05-19 MED ORDER — MIDAZOLAM HCL 2 MG/2ML IJ SOLN
INTRAMUSCULAR | Status: AC
  Filled 2017-05-19: qty 2

## 2017-05-19 MED ORDER — THROMBIN 5000 UNITS EX SOLR
CUTANEOUS | Status: DC | PRN
  Administered 2017-05-19: 5000 [IU] via TOPICAL

## 2017-05-19 MED ORDER — AMOXICILLIN-POT CLAVULANATE 875-125 MG PO TABS: 1.0000 | ORAL_TABLET | Freq: Two times a day (BID) | ORAL | 0 refills | Status: DC

## 2017-05-19 MED ORDER — OXYMETAZOLINE HCL 0.05 % NA SOLN
NASAL | Status: AC
  Filled 2017-05-19: qty 15

## 2017-05-19 MED ORDER — DOCUSATE SODIUM 100 MG PO CAPS: 100.0000 mg | ORAL_CAPSULE | Freq: Two times a day (BID) | ORAL | 0 refills | Status: DC

## 2017-05-19 MED ORDER — SODIUM CHLORIDE 0.9 % IV BOLUS (SEPSIS)
1000.0000 mL | Freq: Once | INTRAVENOUS | Status: AC
  Administered 2017-05-19: 1000 mL via INTRAVENOUS

## 2017-05-19 MED ORDER — SUGAMMADEX SODIUM 200 MG/2ML IV SOLN
INTRAVENOUS | Status: DC | PRN
  Administered 2017-05-19: 200 mg via INTRAVENOUS

## 2017-05-19 MED ORDER — PROPOFOL 10 MG/ML IV BOLUS
INTRAVENOUS | Status: DC | PRN
  Administered 2017-05-19: 250 mg via INTRAVENOUS

## 2017-05-19 MED ORDER — FENTANYL CITRATE (PF) 100 MCG/2ML IJ SOLN
INTRAMUSCULAR | Status: DC | PRN
  Administered 2017-05-19: 25 ug via INTRAVENOUS
  Administered 2017-05-19: 50 ug via INTRAVENOUS
  Administered 2017-05-19: 25 ug via INTRAVENOUS

## 2017-05-19 MED ORDER — FENTANYL CITRATE (PF) 100 MCG/2ML IJ SOLN
INTRAMUSCULAR | Status: AC
  Filled 2017-05-19: qty 2

## 2017-05-19 MED ORDER — ONDANSETRON HCL 4 MG PO TABS: 4.0000 mg | ORAL_TABLET | Freq: Three times a day (TID) | ORAL | 0 refills | Status: DC | PRN

## 2017-05-19 MED ORDER — LIDOCAINE HCL (CARDIAC) 20 MG/ML IV SOLN
INTRAVENOUS | Status: DC | PRN
  Administered 2017-05-19: 100 mg via INTRAVENOUS

## 2017-05-19 SURGICAL SUPPLY — 28 items
BANDAGE EYE OVAL (MISCELLANEOUS) IMPLANT
BLADE SURG 15 STRL LF DISP TIS (BLADE) ×1 IMPLANT
BLADE SURG 15 STRL SS (BLADE) ×2
CANISTER SUCT 1200ML W/VALVE (MISCELLANEOUS) ×3 IMPLANT
COAG SUCT 10F 3.5MM HAND CTRL (MISCELLANEOUS) ×3 IMPLANT
DRESSING NASL FOAM PST OP SINU (MISCELLANEOUS) IMPLANT
DRSG NASAL FOAM POST OP SINU (MISCELLANEOUS)
GAUZE PACK 2X3YD (MISCELLANEOUS) IMPLANT
GLOVE BIO SURGEON STRL SZ7.5 (GLOVE) ×6 IMPLANT
GOWN STRL REUS W/ TWL LRG LVL3 (GOWN DISPOSABLE) ×2 IMPLANT
GOWN STRL REUS W/TWL LRG LVL3 (GOWN DISPOSABLE) ×4
LABEL OR SOLS (LABEL) IMPLANT
NS IRRIG 500ML POUR BTL (IV SOLUTION) ×3 IMPLANT
PACK HEAD/NECK (MISCELLANEOUS) ×3 IMPLANT
PACKING NASAL EPIS 4X2.4 XEROG (MISCELLANEOUS) ×6 IMPLANT
PATTIES SURGICAL .5 X3 (DISPOSABLE) ×3 IMPLANT
SOL ANTI-FOG 6CC FOG-OUT (MISCELLANEOUS) ×1 IMPLANT
SOL FOG-OUT ANTI-FOG 6CC (MISCELLANEOUS) ×2
SPLINT NASAL REUTER .5MM (MISCELLANEOUS) IMPLANT
SPLINT NASAL SEPTAL PRE-CUT (MISCELLANEOUS) IMPLANT
SPOGE SURGIFLO 8M (HEMOSTASIS) ×2
SPONGE SURGIFLO 8M (HEMOSTASIS) ×1 IMPLANT
SUT CHROMIC 4 0 RB 1X27 (SUTURE) ×3 IMPLANT
SUT ETH BLK MONO 3 0 FS 1 12/B (SUTURE) ×3 IMPLANT
SYR 30ML LL (SYRINGE) IMPLANT
SYR 3ML LL SCALE MARK (SYRINGE) IMPLANT
TAMPON NAS POPE NO STRING (MISCELLANEOUS) ×3 IMPLANT
WATER STERILE IRR 1000ML POUR (IV SOLUTION) ×3 IMPLANT

## 2017-05-19 NOTE — ED Notes (Addendum)
Dr. Andee Poles at bedside at this time, pt given clean emesis bag.

## 2017-05-19 NOTE — Discharge Instructions (Signed)

## 2017-05-19 NOTE — ED Triage Notes (Signed)
Pt presents to ED via [facility] with c/o epistaxis. Per EMS pt had nasal surgery approx 6 weeks ago with no known complications, was standing talking to coworkers when he had sudden onset nosebleed. Pt noted to have approx 100cc's of blood in his emesis bag provided by EMS. Pt is noted to be throwing up clots into the bag and gagging.

## 2017-05-19 NOTE — OR Nursing (Signed)
Dr Andee Poles in to see  Pt labs reviewed - pt ok for discharge

## 2017-05-19 NOTE — Anesthesia Post-op Follow-up Note (Signed)
Anesthesia QCDR form completed.        

## 2017-05-19 NOTE — Op Note (Signed)
..  05/19/2017  11:35 AM    Jackson Matthews  [id number]   Pre-Op Dx:  Nasal Hemorrhage  Post-op Dx: Same  Proc:   1)  Endoscopic Control of Nasal Hemorrhage- Complex  Surg:  Jackson Matthews  Anes:  General  EBL:  100  Comp:  None  Indications:  S/p FESS on 04/19/2017 with acute onset of posterior bleeding today.  Per ER report bled in route to ER via EMS, another in ER room, and then emesis of of blood in ER.  Patient packed with bilateral 10cm Merocel sponges in ER and brought emergently to OR.  Findings: Left sphenopalatine arterial bleed near inferior aspect of the root of left middle turbinate.  Diffuse mild bleeding bilaterally most likely from placement of merocel packing.    Procedure: After the patient was identified in ER and the benefits of the procedure were reviewed as well as the consent and risks.  The patient was taken to the operating room and with the patient in a comfortable supine position,  general orotracheal anesthesia was induced without difficulty with McGraff .  A proper time-out was performed.  The patient next received preoperative Afrin spray for topical decongestion and vasoconstriction.  The patient's right merocel sponge was removed from the patient's nasal cavity.  The right side of the nasal cavity was next inspected with a zero degree endoscope and mild diffuse bleeding site was identified and cauterized with bovie suction cautery.  At this time, attention was directed to his left nasal cavity.  The packing in placed was removed with curved clamp.  Using a zero degree endoscope and size 2 suction, blood and clot was removed from the patient's nasal cavity.  This demonstrated post-operative changes with arterial bright red blood coming from the inferior aspect of the middle turbinate near its root where it connects with the medial wall of the maxillary sinus near the posterior aspect of the maxillary antrostomy.  This area was  extensively cauterized.  A second arterial bleeding site was identified more medially and this was also controlled with Bovie suction cautery.  The turbinate was medialized and the lateral aspect as cauterized.  Bleeding was also noted from near the area of the sphenopalatine artery inserts into the middle turbinate.  The mucosa was removed with bovie suction cautery and the area of bleeding and the artery was extensively cauterized.  The sinus cavities were next copiously irrigated with sterile saline.  Meticulous hemostasis was noted and positive pressure from Anesthesia also revealed continued hemostasis with no evidence of any bleeding.  Surgiflow foam was placed medial and lateral to the middle turbinate on the left and in the ethmoid cavity on the right.  The middle turbinate was then medialized bilaterally and Xerogel was placed lateral to the middle turbinates and seated firmly in the area of the bleeding.  Surgiflow was again placed after the Xerogel was inflated with sterile water.  Care of the patient at this time was transferred to Anestheisa.   Dispo:   PACU  Plan: Ice, elevation, narcotic analgesia and prophylactic antibiotics.  Jackson Matthews 05/19/2017 11:35 AM

## 2017-05-19 NOTE — Anesthesia Procedure Notes (Signed)
Procedure Name: Intubation Date/Time: 05/19/2017 10:18 AM Performed by: Irving Burton Pre-anesthesia Checklist: Patient identified, Patient being monitored, Timeout performed, Emergency Drugs available and Suction available Patient Re-evaluated:Patient Re-evaluated prior to induction Oxygen Delivery Method: Circle system utilized Preoxygenation: Pre-oxygenation with 100% oxygen Induction Type: IV induction and Rapid sequence Laryngoscope Size: Glidescope and 3 Grade View: Grade I Tube type: Oral Tube size: 7.5 mm Number of attempts: 1 Airway Equipment and Method: Stylet Placement Confirmation: ETT inserted through vocal cords under direct vision,  positive ETCO2 and breath sounds checked- equal and bilateral Secured at: 22 cm Tube secured with: Tape Dental Injury: Teeth and Oropharynx as per pre-operative assessment

## 2017-05-19 NOTE — Transfer of Care (Signed)
Immediate Anesthesia Transfer of Care Note  Patient: Jackson Matthews  Procedure(s) Performed: Procedure(s): POST OP SINUS BLEED (N/A)  Patient Location: PACU  Anesthesia Type:General  Level of Consciousness: awake  Airway & Oxygen Therapy: aerosol face mask  Post-op Assessment: Post -op Vital signs reviewed and stable  Post vital signs: stable  Last Vitals:  Vitals:   05/19/17 0934 05/19/17 1141  BP: (!) 124/99 107/66  Pulse: 97 66  Resp: (!) 22 14  Temp: 36.6 C (!) 36.2 C  SpO2: 97% 99%    Last Pain:  Vitals:   05/19/17 1141  TempSrc: Temporal         Complications: No apparent anesthesia complications

## 2017-05-19 NOTE — Anesthesia Preprocedure Evaluation (Signed)
Anesthesia Evaluation  Patient identified by MRN, date of birth, ID band Patient awake    Reviewed: Allergy & Precautions, NPO status , Patient's Chart, lab work & pertinent test results  History of Anesthesia Complications Negative for: history of anesthetic complications  Airway Mallampati: II  TM Distance: >3 FB     Dental no notable dental hx. (+) Dental Advidsory Given   Pulmonary neg pulmonary ROS,           Cardiovascular negative cardio ROS       Neuro/Psych negative neurological ROS     GI/Hepatic negative GI ROS, Neg liver ROS,   Endo/Other  negative endocrine ROS  Renal/GU Renal disease  negative genitourinary   Musculoskeletal   Abdominal   Peds negative pediatric ROS (+)  Hematology negative hematology ROS (+)   Anesthesia Other Findings Past Medical History: No date: Kidney stones No date: Sinusitis, chronic  Post-surgical sinus bleed, emergent, vomited 500 cc blood in the ED.  Reproductive/Obstetrics                             Anesthesia Physical  Anesthesia Plan  ASA: II and emergent  Anesthesia Plan: General   Post-op Pain Management:    Induction: Intravenous, Rapid sequence and Cricoid pressure planned  PONV Risk Score and Plan: 2 and Ondansetron and Dexamethasone  Airway Management Planned: Oral ETT  Additional Equipment:   Intra-op Plan:   Post-operative Plan: Extubation in OR  Informed Consent: I have reviewed the patients History and Physical, chart, labs and discussed the procedure including the risks, benefits and alternatives for the proposed anesthesia with the patient or authorized representative who has indicated his/her understanding and acceptance.     Plan Discussed with:   Anesthesia Plan Comments:         Anesthesia Quick Evaluation

## 2017-05-19 NOTE — Anesthesia Postprocedure Evaluation (Signed)
Anesthesia Post Note  Patient: Jackson Matthews  Procedure(s) Performed: Procedure(s) (LRB): POST OP SINUS BLEED (N/A)  Patient location during evaluation: PACU Anesthesia Type: General Level of consciousness: awake and alert Pain management: pain level controlled Vital Signs Assessment: post-procedure vital signs reviewed and stable Respiratory status: spontaneous breathing, nonlabored ventilation, respiratory function stable and patient connected to nasal cannula oxygen Cardiovascular status: blood pressure returned to baseline and stable Postop Assessment: no signs of nausea or vomiting Anesthetic complications: no     Last Vitals:  Vitals:   05/19/17 1245 05/19/17 1310  BP: 129/83 123/81  Pulse: 91 72  Resp: 18 18  Temp: (!) 36.3 C   SpO2: 100% 98%    Last Pain:  Vitals:   05/19/17 1245  TempSrc: Oral  PainSc:                  Lenard Simmer

## 2017-05-19 NOTE — ED Notes (Signed)
ENT car, TXA, and Afrin at bedside, explained to patient awaiting arrival of ENT Dr. Andee Poles.

## 2017-05-19 NOTE — ED Provider Notes (Signed)
[facility] Emergency Department Provider Note  ____________________________________________  Time seen: Approximately 10:05 AM  I have reviewed the triage vital signs and the nursing notes.   HISTORY  Chief Complaint Epistaxis   HPI Jackson Matthews is a 51 y.o. male  POD 30 from sinus surgery who presents for evaluation of epistaxis. Patient was at work when he started to have severe nosebleed. Per EMS patient lost about 100cc of blood at the scne and another 100 cc en route. Patient is not on blood thinners. Bleeding has been constant and moderate since it started just PTA. No dizziness, CP, syncope. No trauma to the nose.   Past Medical History:  Diagnosis Date  . Kidney stones   . Sinusitis, chronic     There are no active problems to display for this patient.   Past Surgical History:  Procedure Laterality Date  . ETHMOIDECTOMY Bilateral 04/19/2017   Procedure: ETHMOIDECTOMY RIGHT ANTERIOR / LEFT TOTAL ETHMOIDECTOMY;  Surgeon: Geanie Logan, MD;  Location: [facility];  Service: ENT;  Laterality: Bilateral;  . 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 FRONTAL SINUS EXPLORATION Bilateral 04/19/2017   Procedure: FRONTAL SINUS EXPLORATION;  Surgeon: Geanie Logan, MD;  Location: [facility];  Service: ENT;  Laterality: Bilateral;  . IMAGE GUIDED SINUS SURGERY N/A 04/19/2017   Procedure: IMAGE GUIDED SINUS SURGERY;  Surgeon: Geanie Logan, MD;  Location: [facility];  Service: ENT;  Laterality: N/A;    LEFT SPHENOIDECTOMY NOT PERFORMED POLYPS WERE BLOCKING THE OPENING AND WHEN POLYPS WERE REMOVED THE SINUS CAVITY WAS OPEN   NEED DISK GAVE DISK TO CECE 6-6- KP  . MAXILLARY ANTROSTOMY Bilateral 04/19/2017   Procedure: MAXILLARY ANTROSTOMY;  Surgeon: Geanie Logan, MD;   Location: [facility];  Service: ENT;  Laterality: Bilateral;  . POLYPECTOMY Left 04/19/2017   Procedure: POLYPECTOMY NASAL ENDOSCOPY LEFT;  Surgeon: Geanie Logan, MD;  Location: [facility];  Service: ENT;  Laterality: Left;  . TURBINATE REDUCTION Bilateral 04/19/2017   Procedure: TURBINATE COBLATION;  Surgeon: Geanie Logan, MD;  Location: [facility];  Service: ENT;  Laterality: Bilateral;    Prior to Admission medications   Medication Sig Start Date End Date Taking? Authorizing Provider  azelastine (ASTELIN) 0.1 % nasal spray Place into both nostrils 2 (two) times daily. Use in each nostril as directed    [provider]  cefdinir (OMNICEF) 300 MG capsule Take 1 capsule (300 mg total) by mouth 2 (two) times daily. 04/19/17   Geanie Logan, MD  HYDROcodone-acetaminophen (NORCO/VICODIN) 5-325 MG tablet Take 1-2 tablets by mouth every 4 (four) hours as needed for moderate pain. 04/19/17   Geanie Logan, MD  montelukast (SINGULAIR) 10 MG tablet Take 10 mg by mouth daily.    [provider]  tamsulosin (FLOMAX) 0.4 MG CAPS capsule Take 1 capsule (0.4 mg total) by mouth daily. Patient not taking: Reported on 03/28/2017 01/29/17   Schaevitz, Myra Rude, MD    Allergies Patient has no known allergies.  History reviewed. No pertinent family history.  Social History Social History  Substance Use Topics  . Smoking status: Never Smoker  . Smokeless tobacco: Never Used  . Alcohol use Yes     Comment: occ., 1x/mo    Review of Systems  Constitutional: Negative for fever. Eyes: Negative for visual changes. ENT: Negative  for sore throat. + epistaxis Neck: No neck pain  Cardiovascular: Negative for chest pain. Respiratory: Negative for shortness of breath. Gastrointestinal: Negative for abdominal pain, vomiting or diarrhea. Genitourinary: Negative for dysuria. Musculoskeletal: Negative for back pain. Skin: Negative for rash. Neurological: Negative  for headaches, weakness or numbness. Psych: No SI or HI  ____________________________________________   PHYSICAL EXAM:  VITAL SIGNS: ED Triage Vitals  Enc Vitals Group     BP 05/19/17 0934 (!) 124/99     Pulse Rate 05/19/17 0934 97     Resp 05/19/17 0934 (!) 22     Temp 05/19/17 0934 97.8 F (36.6 C)     Temp Source 05/19/17 0934 Axillary     SpO2 05/19/17 0934 97 %     Weight 05/19/17 0929 223 lb (101.2 kg)     Height 05/19/17 0929 6' (1.829 m)     Head Circumference --      Peak Flow --      Pain Score --      Pain Loc --      Pain Edu? --      Excl. in GC? --     Constitutional: Alert and oriented. Well appearing and in no apparent distress. HEENT:      Head: Normocephalic and atraumatic.         Eyes: Conjunctivae are normal. Sclera is non-icteric.       Nose: active large volume epistaxis from bilateral nose       Mouth/Throat: Mucous membranes are moist.       Neck: Supple with no signs of meningismus. Cardiovascular: Tachycardic with regular rhythm. No murmurs, gallops, or rubs. 2+ symmetrical distal pulses are present in all extremities. No JVD. Respiratory: Normal respiratory effort. Lungs are clear to auscultation bilaterally. No wheezes, crackles, or rhonchi.  Neurologic: Normal speech and language. Face is symmetric. Moving all extremities. No gross focal neurologic deficits are appreciated. Skin: Skin is warm, dry and intact. No rash noted. Psychiatric: Mood and affect are normal. Speech and behavior are normal.  ____________________________________________   LABS (all labs ordered are listed, but only abnormal results are displayed)  Labs Reviewed  CBC  TYPE AND SCREEN   ____________________________________________  EKG  none  ____________________________________________  RADIOLOGY  none  ____________________________________________   PROCEDURES  Procedure(s) performed: None Procedures Critical Care performed: yes CRITICAL  CARE Performed by: Nita Sickle  ?  Total critical care time: 30 min  Critical care time was exclusive of separately billable procedures and treating other patients.  Critical care was necessary to treat or prevent imminent or life-threatening deterioration.  Critical care was time spent personally by me on the following activities: development of treatment plan with patient and/or surrogate as well as nursing, discussions with consultants, evaluation of patient's response to treatment, examination of patient, obtaining history from patient or surrogate, ordering and performing treatments and interventions, ordering and review of laboratory studies, ordering and review of radiographic studies, pulse oximetry and re-evaluation of patient's condition.  ____________________________________________   INITIAL IMPRESSION / ASSESSMENT AND PLAN / ED COURSE  51 y.o. male  POD 30 from sinus surgery who presents for evaluation of large volume epistaxis. Patient arrives tachycardic but stable BP, not on blood thinners. Bleeding unable to be stopped with afrin and anterior pressure. Significant amount of posterior pharynx bleeding. Due to recent surgery I was hesitant to pack patient's nose and therefore called Dr. Andee Poles to the bedside. Patient had large bore IV placed, type and screen sent. Suction  provided to patient. Patient lost about 500cc of blood total. Dr. Andee Poles came to the ED and packed both nares with significant improvement of the bleeding and patient was taken emergently to the OR for bleeding control.     Pertinent labs & imaging results that were available during my care of the patient were reviewed by me and considered in my medical decision making (see chart for details).    ____________________________________________   FINAL CLINICAL IMPRESSION(S) / ED DIAGNOSES  Final diagnoses:  Epistaxis  Acute hemorrhage      NEW MEDICATIONS STARTED DURING THIS VISIT:  New  Prescriptions   No medications on file     Note:  This document was prepared using Dragon voice recognition software and may include unintentional dictation errors.    Don Perking, [place], MD 05/19/17 1011

## 2017-05-19 NOTE — ED Notes (Signed)
Suction set up at bedside, pt given clean emesis bag. Pt noted to have put out approx 100cc of blood into emesis bag.

## 2017-05-19 NOTE — ED Notes (Signed)
Consent for surgery signed and placed in patient chart.

## 2019-05-16 ENCOUNTER — Telehealth: Payer: Self-pay

## 2019-05-16 NOTE — Telephone Encounter (Signed)
error 

## 2019-05-22 ENCOUNTER — Telehealth: Payer: Self-pay | Admitting: Internal Medicine

## 2019-05-22 NOTE — Telephone Encounter (Signed)
Called patient for COVID-19 pre-screening for in office visit.  Have you recently traveled any where out of the local area in the last 2 weeks? No  Have you been in close contact with a person diagnosed with COVID-19 or someone awaiting results within the last 2 weeks? No  Do you currently have any of the following symptoms? If so, when did they start? Cough (yes)    Diarrhea   Joint Pain Fever      Muscle Pain   Red eyes Shortness of breath   Abdominal pain  Vomiting Loss of smell    Rash    Sore Throat Headache    Weakness   Bruising or bleeding

## 2019-05-22 NOTE — Telephone Encounter (Signed)
Cough x1y per Altamese Dilling verbally- okay to proceed.

## 2019-05-23 ENCOUNTER — Ambulatory Visit
Admission: RE | Admit: 2019-05-23 | Discharge: 2019-05-23 | Disposition: A | Discharge status: Home / Self Care | Payer: BC Managed Care – PPO | Source: Ambulatory Visit | Attending: Internal Medicine | Admitting: Internal Medicine

## 2019-05-23 ENCOUNTER — Other Ambulatory Visit: Payer: Self-pay

## 2019-05-23 ENCOUNTER — Ambulatory Visit: Payer: BC Managed Care – PPO | Admitting: Internal Medicine

## 2019-05-23 ENCOUNTER — Encounter: Payer: Self-pay | Admitting: Internal Medicine

## 2019-05-23 VITALS — BP 130/72 | HR 86 | Temp 98.4°F | Ht 72.0 in | Wt 240.0 lb

## 2019-05-23 DIAGNOSIS — R05 Cough: Secondary | ICD-10-CM | POA: Diagnosis not present

## 2019-05-23 DIAGNOSIS — J4540 Moderate persistent asthma, uncomplicated: Secondary | ICD-10-CM

## 2019-05-23 MED ORDER — BUDESONIDE-FORMOTEROL FUMARATE 160-4.5 MCG/ACT IN AERO: 2.0000 | INHALATION_SPRAY | Freq: Two times a day (BID) | RESPIRATORY_TRACT | 0 refills | Status: DC

## 2019-05-23 NOTE — Patient Instructions (Addendum)
Will check a chest x ray.  Will start inhaler. Symbicort 2 puffs twice daily, rinse mouth after use.  Continue omeprazole for possible reflux.  Continue nasal spray, singulair.  Start antihistamine like claritin, allegra or zyrtec.

## 2019-05-23 NOTE — Progress Notes (Signed)
[facility] [place] [facility] Consultation      Assessment and Plan:  Chronic cough, uncertain etiology.  Patient's symptoms appear worse outside than inside, with multiple known environmental allergies. -Possible etiologies include allergic cough, allergic bronchitis, chronic allergic rhinitis, GERD. - Continue empiric PPI, nasal spray.  Start antihistamine.  Continue Singulair. - We will add empiric Symbicort 160 inhaler - We will check chest x-ray.  Orders Placed This Encounter  Procedures  . DG Chest 2 View  . CBC with Differential/Platelet  . IgE   Meds ordered this encounter  Medications  . budesonide-formoterol (SYMBICORT) 160-4.5 MCG/ACT inhaler    Sig: Inhale 2 puffs into the lungs 2 (two) times daily for 1 day.    Dispense:  1 Inhaler    Refill:  0    Order Specific Question:   Lot Number?    Answer:   [id number]    Order Specific Question:   Manufacturer?    Answer:   [employer] [[id number]]    Order Specific Question:   Quantity    Answer:   1   Return in about 5 weeks (around 06/27/2019).    Date: 05/23/2019  MRN# [id number] Jackson Matthews 1966-03-03   Jackson Matthews is a 53 y.o. old male seen in consultation for chief complaint of:    Chief Complaint  Patient presents with  . pulmonary consult    per Dr. Toy Care- pt reports of non prod cough x1y. round of prednisone and abx with no relief in cough.     HPI:  Jackson Matthews is a 53 y.o. old male, it started a couple of years ago it was though it was due to sinus infections. He had a nasal polypectomy which helped with the sinus infections but did nto help with the cough.  It is not present in the house, but worse when going outside. Present all year round. He has been tried on tessalon which did not help, neither did singulair, nyquil, OTC cough medicine. He does not really have reflux but omeprazole seemed to help.  Prednisone and abx help short term but it comes back.  He was tried on an inhaler but not  sure which one.  He has a dog at home, all over house, not in bedroom. No cats.  Denies sinus drainage.   He works in Production designer, theatre/television/film, Stage manager. No work in Holiday representative. Cough is better when he is in the office.   He had allergy testing about a year ago at ENT office, he is currently doing allergy drops for cats, dust, trees. He was taking the shots but had to stop due to covid, so is now doing drops. He has never been a smoker, his parents smoked.     PMHX:   Past Medical History:  Diagnosis Date  . Kidney stones   . Sinusitis, chronic    Surgical Hx:  Past Surgical History:  Procedure Laterality Date  . ETHMOIDECTOMY Bilateral 04/19/2017   Procedure: ETHMOIDECTOMY RIGHT ANTERIOR / LEFT TOTAL ETHMOIDECTOMY;  Surgeon: Geanie Logan, MD;  Location: [facility];  Service: ENT;  Laterality: Bilateral;  . 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 FRONTAL SINUS EXPLORATION Bilateral 04/19/2017   Procedure: FRONTAL SINUS EXPLORATION;  Surgeon: Geanie Logan, MD;  Location: [facility];  Service: ENT;  Laterality: Bilateral;  . IMAGE GUIDED SINUS SURGERY  N/A 04/19/2017   Procedure: IMAGE GUIDED SINUS SURGERY;  Surgeon: Geanie LoganBennett, Paul, MD;  [facility];  Service: ENT;  Laterality: N/A;    LEFT SPHENOIDECTOMY NOT PERFORMED POLYPS WERE BLOCKING THE OPENING AND WHEN POLYPS WERE REMOVED THE SINUS CAVITY WAS OPEN   NEED DISK GAVE DISK TO CECE 6-6- KP  . MAXILLARY ANTROSTOMY Bilateral 04/19/2017   Procedure: MAXILLARY ANTROSTOMY;  Surgeon: Geanie LoganBennett, Paul, MD;  [facility];  Service: ENT;  Laterality: Bilateral;  . POLYPECTOMY Left 04/19/2017   Procedure: POLYPECTOMY NASAL ENDOSCOPY LEFT;  Surgeon: Geanie LoganBennett, Paul, MD;  [facility];  Service: ENT;  Laterality: Left;  .  SEPTOPLASTY N/A 05/19/2017   Procedure: POST OP SINUS BLEED;  Surgeon: Bud FaceVaught, Creighton, MD;  Location: [facility] OR[facility];  Service: ENT;  Laterality: N/A;  . TURBINATE REDUCTION Bilateral 04/19/2017   Procedure: TURBINATE COBLATION;  Surgeon: Geanie LoganBennett, Paul, MD;  [facility];  Service: ENT;  Laterality: Bilateral;   Family Hx:  No family history on file. Social Hx:   Social History   Tobacco Use  . Smoking status: Never Smoker  . Smokeless tobacco: Never Used  Substance Use Topics  . Alcohol use: Yes    Comment: occ., 1x/mo  . Drug use: No   Medication:    Current Outpatient Medications:  .  Azelastine HCl 137 MCG/SPRAY SOLN, Place 2 sprays into both nostrils 2 (two) times daily., Disp: , Rfl:  .  EPINEPHrine 0.3 mg/0.3 mL IJ SOAJ injection, See admin instructions., Disp: , Rfl:  .  ibuprofen (ADVIL) 200 MG tablet, Take by mouth., Disp: , Rfl:  .  omeprazole (PRILOSEC) 40 MG capsule, TAKE ONE CAPSULE BY MOUTH ONCE DAILY, 30 MINUTES PRIOR TO EVENING MEAL., Disp: , Rfl:    Allergies:  Patient has no known allergies.  Review of Systems: Gen:  Denies  fever, sweats, chills HEENT: Denies blurred vision, double vision. bleeds, sore throat Cvc:  No dizziness, chest pain. Resp:   Denies cough or sputum production, shortness of breath Gi: Denies swallowing difficulty, stomach pain. Gu:  Denies bladder incontinence, burning urine Ext:   No Joint pain, stiffness. Skin: No skin rash,  hives  Endoc:  No polyuria, polydipsia. Psych: No depression, insomnia. Other:  All other systems were reviewed with the patient and were negative other that what is mentioned in the HPI.   Physical Examination:   VS: BP 130/72 (BP Location: Left Arm, Cuff Size: Normal)   Pulse 86   Temp 98.4 F (36.9 C) (Temporal)   Ht 6' (1.829 m)   Wt 240 lb (108.9 kg)   SpO2 95%   BMI 32.55 kg/m   General Appearance: No distress  Neuro:without focal findings,  speech normal,  HEENT: PERRLA, EOM  intact.  Mallampati 3. Pulmonary: normal breath sounds, No wheezing.  CardiovascularNormal S1,S2.  No m/r/g.   Abdomen: Benign, Soft, non-tender. Renal:  No costovertebral tenderness  GU:  No performed at this time. Endoc: No evident thyromegaly, no signs of acromegaly. Skin:   warm, no rashes, no ecchymosis  Extremities: normal, no cyanosis, clubbing.  Other findings:    LABORATORY PANEL:   CBC No results for input(s): WBC, HGB, HCT, PLT in the last 168 hours. ------------------------------------------------------------------------------------------------------------------  Chemistries  No results for input(s): NA, K, CL, CO2, GLUCOSE, BUN, CREATININE, CALCIUM, MG, AST, ALT, ALKPHOS, BILITOT in the last 168 hours.  Invalid input(s): GFRCGP ------------------------------------------------------------------------------------------------------------------  Cardiac Enzymes No results for input(s): TROPONINI in the last 168 hours. ------------------------------------------------------------  RADIOLOGY:  No results found.     Thank  you for the consultation and for allowing [facility] to assist in the care of your patient. Our recommendations are noted above.  Please contact us if we can be of further service.   Marda Stalker, M.D., F.C.C.P.  Board Certified in Internal Medicine, Pulmonary Medicine, [place], and Sleep Medicine.  [facility] Office Number: [phone]   05/23/2019

## 2019-06-27 ENCOUNTER — Ambulatory Visit: Payer: BC Managed Care – PPO | Admitting: Internal Medicine

## 2019-06-27 ENCOUNTER — Other Ambulatory Visit: Payer: Self-pay

## 2019-06-27 ENCOUNTER — Encounter: Payer: Self-pay | Admitting: Pulmonary Disease

## 2019-06-27 ENCOUNTER — Other Ambulatory Visit
Admission: RE | Admit: 2019-06-27 | Discharge: 2019-06-27 | Disposition: A | Discharge status: Home / Self Care | Payer: BC Managed Care – PPO | Source: Ambulatory Visit | Attending: Pulmonary Disease | Admitting: Pulmonary Disease

## 2019-06-27 ENCOUNTER — Ambulatory Visit: Payer: BC Managed Care – PPO | Admitting: Pulmonary Disease

## 2019-06-27 VITALS — BP 132/78 | HR 71 | Temp 98.0°F | Ht 72.0 in | Wt 238.6 lb

## 2019-06-27 DIAGNOSIS — J45991 Cough variant asthma: Secondary | ICD-10-CM | POA: Diagnosis not present

## 2019-06-27 DIAGNOSIS — R059 Cough, unspecified: Secondary | ICD-10-CM

## 2019-06-27 DIAGNOSIS — R05 Cough: Secondary | ICD-10-CM | POA: Diagnosis not present

## 2019-06-27 LAB — CBC WITH DIFFERENTIAL/PLATELET
Abs Immature Granulocytes: 0.02 10*3/uL (ref 0.00–0.07)
Basophils Absolute: 0 10*3/uL (ref 0.0–0.1)
Basophils Relative: 1 %
Eosinophils Absolute: 0.1 10*3/uL (ref 0.0–0.5)
Eosinophils Relative: 3 %
HCT: 49.1 % (ref 39.0–52.0)
Hemoglobin: 16.3 g/dL (ref 13.0–17.0)
Immature Granulocytes: 0 %
Lymphocytes Relative: 37 %
Lymphs Abs: 2 10*3/uL (ref 0.7–4.0)
MCH: 29.7 pg (ref 26.0–34.0)
MCHC: 33.2 g/dL (ref 30.0–36.0)
MCV: 89.4 fL (ref 80.0–100.0)
Monocytes Absolute: 0.6 10*3/uL (ref 0.1–1.0)
Monocytes Relative: 10 %
Neutro Abs: 2.7 10*3/uL (ref 1.7–7.7)
Neutrophils Relative %: 49 %
Platelets: 158 10*3/uL (ref 150–400)
RBC: 5.49 MIL/uL (ref 4.22–5.81)
RDW: 13 % (ref 11.5–15.5)
WBC: 5.5 10*3/uL (ref 4.0–10.5)
nRBC: 0 % (ref 0.0–0.2)

## 2019-06-27 MED ORDER — SPIRIVA RESPIMAT 2.5 MCG/ACT IN AERS: 2.0000 | INHALATION_SPRAY | Freq: Every day | RESPIRATORY_TRACT | 0 refills | Status: DC

## 2019-06-27 NOTE — Patient Instructions (Addendum)
1.  We will check breathing tests.  2.  We will check a blood test to see the degree of allergy response.  3.  Stop Singulair, stop Symbicort.  Start Spiriva 2 puffs once a day.  Call us if this is effective.  4.  Resume Zyrtec 1 tablet at bedtime.  5.  We will see you back in 4 to 6 weeks time.

## 2019-06-29 LAB — IGE: IgE (Immunoglobulin E), Serum: 20 IU/mL (ref 6–495)

## 2019-07-05 ENCOUNTER — Telehealth: Payer: Self-pay | Admitting: Pulmonary Disease

## 2019-07-05 MED ORDER — SPIRIVA RESPIMAT 2.5 MCG/ACT IN AERS: 2.0000 | INHALATION_SPRAY | Freq: Every day | RESPIRATORY_TRACT | 2 refills | Status: DC

## 2019-07-05 NOTE — Telephone Encounter (Signed)
Spoke to pt, who is requesting Rx for spiriva 2.5. pt was given sample at last OV and feels this medication is effective.  Rx has been sent to preferred pharmacy. Nothing further is needed.

## 2019-07-26 ENCOUNTER — Encounter: Payer: Self-pay | Admitting: Pulmonary Disease

## 2019-07-26 ENCOUNTER — Other Ambulatory Visit: Payer: Self-pay

## 2019-07-26 ENCOUNTER — Ambulatory Visit: Payer: BC Managed Care – PPO | Admitting: Pulmonary Disease

## 2019-07-26 VITALS — BP 128/80 | HR 89 | Temp 98.0°F | Ht 72.0 in | Wt 240.4 lb

## 2019-07-26 DIAGNOSIS — J0141 Acute recurrent pansinusitis: Secondary | ICD-10-CM

## 2019-07-26 DIAGNOSIS — J45991 Cough variant asthma: Secondary | ICD-10-CM | POA: Diagnosis not present

## 2019-07-26 DIAGNOSIS — R05 Cough: Secondary | ICD-10-CM | POA: Diagnosis not present

## 2019-07-26 DIAGNOSIS — R059 Cough, unspecified: Secondary | ICD-10-CM

## 2019-07-26 MED ORDER — PREDNISONE 10 MG (21) PO TBPK: ORAL_TABLET | ORAL | 0 refills | Status: DC

## 2019-07-26 MED ORDER — CLARITHROMYCIN 500 MG PO TABS: 500.0000 mg | ORAL_TABLET | Freq: Two times a day (BID) | ORAL | 0 refills | Status: DC

## 2019-07-26 NOTE — Progress Notes (Signed)
Subjective:    Patient ID: Jackson Matthews, male    DOB: 1965/12/27, 53 y.o.   MRN: [id number]  HPI Jackson Matthews is a 53 year old lifelong never smoker who presents for a follow-up from his visit of 27 June 2019 with the issue of chronic cough of over 2 years duration.  He had been previously evaluated by Jackson Matthews on 26 August.  The patient was treated for potential cough variant asthma and taken off Symbicort and placed on Spiriva.  He states that initially that actually was working very well for him and the cough was about "75% better" but subsequently about 2 to 2-1/2 weeks ago he was asked to follow a stricter face masking due to the COVID-19 pandemic and this has caused him to have issues with cough again.  He also notices increasing nasal discharge with greenish purulent drainage which usually indicates recurrent sinusitis for him.  He has not had any fevers, chills or sweats.  He has noted some chest congestion in association with the nasal congestion.  He has not had any dyspnea, chest pain, lower extremity edema, myalgias or arthralgias.  Other than the nasal congestion and purulent drainage he does not feel ill.  Review of Systems A 10 point review of systems was performed and it is as noted above otherwise negative.    Objective:   Physical Exam Vitals signs and nursing note reviewed.  Constitutional:      General: He is awake.     Appearance: Normal appearance. He is overweight.  HENT:  Deferred due to masking requirements for COVID-19 Eyes:     General: No scleral icterus.    Conjunctiva/sclera: Conjunctivae normal.     Pupils: Pupils are equal, round, and reactive to light.  Neck:     Musculoskeletal: Normal range of motion.     Vascular: No JVD.     Trachea: Trachea and phonation normal.  Cardiovascular:     Rate and Rhythm: Normal rate and regular rhythm.     Pulses: Normal pulses.     Heart sounds: Normal heart sounds.  Pulmonary:     Effort: Pulmonary  effort is normal.     Breath sounds: Rhonchi noted in the upper lung zones.  No wheezes. Abdominal:     General: There is no distension.  Musculoskeletal: Normal range of motion.     Right lower leg: No edema.     Left lower leg: No edema.  Skin:    General: Skin is warm and dry.  Neurological:     General: No focal deficit present.     Mental Status: He is alert and oriented to person, place, and time.  Psychiatric:        Mood and Affect: Mood normal.        Behavior: Behavior normal. Behavior is cooperative.    During his last visit he had IgE and RAST performed these were negative.  He has pulmonary function testing upcoming on 5 January.  Delay is due to COVID-19 testing restrictions.    Assessment & Plan:   1.  Acute recurrent pansinusitis: Patient is having symptoms of recurrent sinusitis.  Will treat with Biaxin 500 mg twice a day x10 days and prednisone taper.  These were sent to the patient's pharmacy of choice.  We will see the patient in 3 to 4 weeks time he is to contact us prior to that time should any new difficulties arise.  2.  Chronic cough, query cough variant asthma: Continue  Spiriva for now.  He had had improvement on this medication.  Most recent exacerbation was likely related to issues with acute recurrent pansinusitis.  Further medication recommendations per PFTs when these are available.  3.  We provided the patient with a waiver for mask use.  Recommend face shield while at work.  Continue social distancing and frequent handwashing.   This chart was dictated using voice recognition software/Dragon.  Despite best efforts to proofread, errors can occur which can change the meaning.  Any change was purely unintentional.

## 2019-07-26 NOTE — Progress Notes (Signed)
Subjective:    Patient ID: Jackson Matthews, male    DOB: 11/15/1965, 53 y.o.   MRN: [id number]  HPI Mr. Staples is a 53 year old lifelong never smoker who has had issues with a cough of 2 years duration.  He was previously seen by Dr. Ashby Dawes on 23 May 2019.  Please refer to that note for the details.  I have reviewed the note.  Patient notes that he had nasal polypectomy by Dr. Richardson Landry which initially helped issues with chronic sinus infections and drainage.  This however did not help his cough.  The cough is present all year long.  Mostly exacerbated when he goes outside with temperature changes.  He has tried multiple medications to include Singulair, nasal sprays and antihistamines which have not helped.  He has taken PPIs for potential LPR which also has not helped.  He is currently not on any antihistamines.  Dr. Ashby Dawes gave him a trial of Symbicort 160/4.5, however this did not work either.  He has been taking "allergy drops" for sensitivities to cats dust and trees.  He has no cats in the home.  Cough is not productive.  No hemoptysis.  No fevers, chills or sweats.  No chest pain, orthopnea or paroxysmal nocturnal dyspnea.  He is not on ACE inhibitors.  As noted he is a lifelong never smoker.  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  Chest x-ray performed 23 May 2019 showed no active disease.  Review of Systems  Constitutional: Negative.   HENT: Positive for congestion, postnasal drip, rhinorrhea and sinus pressure.   Eyes: Negative.   Respiratory: Positive for cough.   Cardiovascular: Negative.   Gastrointestinal: Negative.        No reflux symptoms  Endocrine: Negative.   Genitourinary: Negative.   Musculoskeletal: Negative.   Skin: Negative.   Allergic/Immunologic: Negative.   Neurological: Negative.   Hematological: Negative.   Psychiatric/Behavioral: Negative.   All other systems reviewed and are negative.      Objective:   Physical Exam Vitals signs and nursing note reviewed.  Constitutional:      General: He is awake.     Appearance: Normal appearance. He is overweight.  HENT:     Head: Normocephalic and atraumatic.     Right Ear: Tympanic membrane, ear canal and external ear normal.     Left Ear: Tympanic membrane, ear canal and external ear normal.     Nose: Mucosal edema and congestion present.     Right Turbinates: Swollen.     Left Turbinates: Swollen.     Mouth/Throat:     Mouth: Mucous membranes are moist.     Pharynx: Oropharynx is clear.  Eyes:     General: No scleral icterus.    Conjunctiva/sclera: Conjunctivae normal.     Pupils: Pupils are equal, round, and reactive to light.  Neck:     Musculoskeletal: Normal range of motion.     Vascular: No JVD.     Trachea: Trachea and phonation normal.  Cardiovascular:     Rate and Rhythm: Normal rate and regular rhythm.     Pulses: Normal pulses.     Heart sounds: Normal heart sounds.  Pulmonary:     Effort: Pulmonary effort is normal.     Breath sounds: Normal breath sounds.  Abdominal:     General: There is no distension.  Musculoskeletal: Normal range of motion.     Right lower leg: No edema.  Left lower leg: No edema.  Skin:    General: Skin is warm and dry.  Neurological:     General: No focal deficit present.     Mental Status: He is alert and oriented to person, place, and time.  Psychiatric:        Mood and Affect: Mood normal.        Behavior: Behavior normal. Behavior is cooperative.     As noted above chest x-ray from 23 May 2019 was reviewed.  No abnormalities noted.      Assessment & Plan:   1.  Cough, chronic: He is a lifelong never smoker and the most likely etiology is upper airway cough syndrome (postnasal drip syndrome) also triggered by many other issues such as LPR.  However he has received proper management of this and has not noticed marked improvement.  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  We will have him restart Zyrtec for his nasal congestion issues.  We will check IgE and RAST.  2.  Cough variant asthma: This is another possibility for his cough.  We will proceed with obtaining pulmonary function testing.  We will discontinue Symbicort as it has been of little help and given a trial of Spiriva 2 puffs once a day.  Hopefully this will turn down his cough reflex.   Follow-up will be in 4 to 6 weeks time he is to contact us prior to that time should any new difficulties arise.   This chart was dictated using voice recognition software/Dragon.  Despite best efforts to proofread, errors can occur which can change the meaning.  Any change was purely unintentional.

## 2019-07-26 NOTE — Patient Instructions (Addendum)
1.  I have sent a prescription of Biaxin to your pharmacy it is antibiotic take twice a day with food.  2.  He also will have a prednisone taper package.  3.  We will see you back in 3 to 4 weeks.  4.  We have written a letter so that you can use a face shield instead of a facemask.

## 2019-08-01 DIAGNOSIS — J301 Allergic rhinitis due to pollen: Secondary | ICD-10-CM | POA: Diagnosis not present

## 2019-08-03 ENCOUNTER — Telehealth: Payer: Self-pay | Admitting: Pulmonary Disease

## 2019-08-03 MED ORDER — CLARITHROMYCIN 500 MG PO TABS: 500.0000 mg | ORAL_TABLET | Freq: Two times a day (BID) | ORAL | 0 refills | Status: DC

## 2019-08-03 MED ORDER — PREDNISONE 20 MG PO TABS: 20.0000 mg | ORAL_TABLET | Freq: Every day | ORAL | 0 refills | Status: DC

## 2019-08-03 MED ORDER — CLARITHROMYCIN 500 MG PO TABS: 500.0000 mg | ORAL_TABLET | Freq: Two times a day (BID) | ORAL | 0 refills | Status: AC

## 2019-08-03 NOTE — Addendum Note (Signed)
Addended by: Maryanna Shape A on: 08/03/2019 02:37 PM   Modules accepted: Orders

## 2019-08-03 NOTE — Telephone Encounter (Signed)
As discussed

## 2019-08-03 NOTE — Telephone Encounter (Signed)
Called and spoke to pt.  Pt stated that he has two days left of Biaxin and he has completed course of prednisone that was prescribed on 07/26/2019.  Pt stated when he was taking prednisone, his cough almost completely subsided. However once he completed course his cough returned and is back to baseline. C/o prod cough with green mucus, sinus pressure between eyes and nasal drainage yellow to greenish in color. Pt taking zytrec daily with no relief.  Denied fever, chills or sweats.   LG please advise. Thanks.

## 2019-08-03 NOTE — Telephone Encounter (Signed)
Per LG verbally- offer 4 more days of Biaxin and 20mg  of prednisone for 5 days.  If sx continue, will need to f/u with ENT.   Left message to make pt aware of recommendations.

## 2019-08-03 NOTE — Telephone Encounter (Signed)
Pt is aware of below recommendations and voiced his understanding.  Rx for Biaxin and prednisone has been sent to preferred pharmacy. Nothing further is needed.

## 2019-08-16 DIAGNOSIS — J301 Allergic rhinitis due to pollen: Secondary | ICD-10-CM | POA: Diagnosis not present

## 2019-08-20 DIAGNOSIS — J301 Allergic rhinitis due to pollen: Secondary | ICD-10-CM | POA: Diagnosis not present

## 2019-08-21 DIAGNOSIS — D140 Benign neoplasm of middle ear, nasal cavity and accessory sinuses: Secondary | ICD-10-CM | POA: Diagnosis not present

## 2019-08-21 DIAGNOSIS — J0181 Other acute recurrent sinusitis: Secondary | ICD-10-CM | POA: Diagnosis not present

## 2019-08-29 ENCOUNTER — Telehealth: Payer: Self-pay

## 2019-08-29 ENCOUNTER — Ambulatory Visit (INDEPENDENT_AMBULATORY_CARE_PROVIDER_SITE_OTHER): Payer: BC Managed Care – PPO | Admitting: Pulmonary Disease

## 2019-08-29 DIAGNOSIS — R05 Cough: Secondary | ICD-10-CM | POA: Diagnosis not present

## 2019-08-29 DIAGNOSIS — J4540 Moderate persistent asthma, uncomplicated: Secondary | ICD-10-CM

## 2019-08-29 DIAGNOSIS — J310 Chronic rhinitis: Secondary | ICD-10-CM | POA: Diagnosis not present

## 2019-08-29 DIAGNOSIS — J324 Chronic pansinusitis: Secondary | ICD-10-CM | POA: Diagnosis not present

## 2019-08-29 DIAGNOSIS — J45991 Cough variant asthma: Secondary | ICD-10-CM

## 2019-08-29 DIAGNOSIS — R059 Cough, unspecified: Secondary | ICD-10-CM

## 2019-08-29 MED ORDER — QVAR REDIHALER 80 MCG/ACT IN AERB: 2.0000 | INHALATION_SPRAY | Freq: Two times a day (BID) | RESPIRATORY_TRACT | 6 refills | Status: DC

## 2019-08-29 MED ORDER — ALBUTEROL SULFATE HFA 108 (90 BASE) MCG/ACT IN AERS: 2.0000 | INHALATION_SPRAY | Freq: Four times a day (QID) | RESPIRATORY_TRACT | 3 refills | Status: DC | PRN

## 2019-08-29 MED ORDER — PREDNISONE 10 MG (21) PO TBPK: ORAL_TABLET | ORAL | 0 refills | Status: DC

## 2019-08-29 NOTE — Patient Instructions (Addendum)
1.  Discontinue Spiriva.  2.  We will send another prednisone taper  3.  New inhaler will be QVAR 2 inhalations twice a day  4.  You will have a "rescue" inhaler Albuterol, use 2 puffs every 6 hours as needed for cough or wheezing  5.  We will see you in follow-up in 3 to 4 weeks time you will see either me or the nurse practitioner

## 2019-08-29 NOTE — Progress Notes (Deleted)
   Subjective:    Patient ID: Jackson Matthews, male    DOB: [birth year], 53 y.o.   MRN: [id number]  HPI    Review of Systems     Objective:   Physical Exam        Assessment & Plan:

## 2019-08-29 NOTE — Progress Notes (Addendum)
   Subjective:    Patient ID: CHRISS MANNAN, male    DOB: 16-Dec-1965, 53 y.o.   MRN: [id number] Virtual Visit Via Video or Telephone Note:   This visit type was conducted due to national recommendations for restrictions regarding the COVID-19 pandemic .  This format is felt to be most appropriate for this patient at this time.  All issues noted in this document were discussed and addressed.  No physical exam was performed (except for noted visual exam findings with Video Visits).    I connected with Zed "Ray" Frisk by telephone at 1600 hrs. and verified that I was speaking with the correct person using two identifiers. Location patient: home Location provider: [facility] Persons participating in the virtual visit: patient, physician   I discussed the limitations, risks, security and privacy concerns of performing an evaluation and management service by telephone and the availability of in person appointments. The patient expressed understanding and agreed to proceed.  HPI 53 year old lifelong never smoker who has issues with chronic sinusitis with frequent acute exacerbations and cough.  She has had follow-up with ENT.  Had a negative chest x-ray in August 2020.  He did well as long as he was on Biaxin and a prednisone taper but then his cough flared up again.  He has been on Spiriva which initially controlled his symptoms but now seems like it is not working.  He has not had any fevers, chills or sweats and has not had any sputum production or hemoptysis.  He voices no other complaint except for persistent cough.  He has this chronic nasal congestion for which he sees ENT.  Patient does note that every time he takes steroids his cough improves dramatically.   Review of Systems A 10 point review of systems was performed and it is as noted above otherwise negative.    Objective:   Physical Exam Physical exam performed this is a telephone visit.  Patient was noted to  cough intermittently during the call.  Voice was otherwise clear.       Assessment & Plan:  Cough Upper airway cough syndrome Other possibilities are cough variant asthma versus eosinophilic bronchitis Prednisone taper Discontinue Spiriva QVAR 80 mcg, 2 inhalations twice a day Albuterol rescue inhaler   Chronic pansinusitis with chronic nonallergic rhinitis  Continue nasal hygiene as prescribed by ENT Continue follow-up with ENT Currently no need for antibiotics  Follow-up will be in 3 to 4 weeks time with either me or the nurse practitioner.  Visit time: 18 minutes  C. Derrill Kay, MD [facility]  *This note was dictated using voice recognition software/Dragon.  Despite best efforts to proofread, errors can occur which can change the meaning.  Any change was purely unintentional.

## 2019-08-30 NOTE — Telephone Encounter (Signed)
Error. Nothing needed at this time.  

## 2019-09-03 DIAGNOSIS — J301 Allergic rhinitis due to pollen: Secondary | ICD-10-CM | POA: Diagnosis not present

## 2019-09-06 DIAGNOSIS — J301 Allergic rhinitis due to pollen: Secondary | ICD-10-CM | POA: Diagnosis not present

## 2019-09-10 DIAGNOSIS — J301 Allergic rhinitis due to pollen: Secondary | ICD-10-CM | POA: Diagnosis not present

## 2019-09-11 DIAGNOSIS — J301 Allergic rhinitis due to pollen: Secondary | ICD-10-CM | POA: Diagnosis not present

## 2019-09-13 DIAGNOSIS — J301 Allergic rhinitis due to pollen: Secondary | ICD-10-CM | POA: Diagnosis not present

## 2019-09-17 DIAGNOSIS — J301 Allergic rhinitis due to pollen: Secondary | ICD-10-CM | POA: Diagnosis not present

## 2019-09-24 DIAGNOSIS — J301 Allergic rhinitis due to pollen: Secondary | ICD-10-CM | POA: Diagnosis not present

## 2019-09-25 ENCOUNTER — Ambulatory Visit: Payer: BC Managed Care – PPO | Admitting: Pulmonary Disease

## 2019-09-25 NOTE — Progress Notes (Deleted)
Attempted to reach patient for televisit however he did not answer.  Patient will need to be rescheduled.

## 2019-09-26 ENCOUNTER — Telehealth: Payer: Self-pay | Admitting: Pulmonary Disease

## 2019-09-26 NOTE — Telephone Encounter (Signed)
Called and spoke to pt regarding need for covid test prior to PFT. Pt wishes to cancel PFT, as he does not wish to have covid test.  Spoke to [place] with cardiopulmonary and canceled PFT. Pt does not wish to reschedule at this time. Nothing further is needed.

## 2019-10-01 ENCOUNTER — Other Ambulatory Visit: Admission: RE | Admit: 2019-10-01 | Payer: BC Managed Care – PPO | Source: Ambulatory Visit

## 2019-10-01 DIAGNOSIS — J301 Allergic rhinitis due to pollen: Secondary | ICD-10-CM | POA: Diagnosis not present

## 2019-10-02 ENCOUNTER — Ambulatory Visit: Payer: BC Managed Care – PPO

## 2019-10-08 DIAGNOSIS — J301 Allergic rhinitis due to pollen: Secondary | ICD-10-CM | POA: Diagnosis not present

## 2019-10-10 DIAGNOSIS — J0181 Other acute recurrent sinusitis: Secondary | ICD-10-CM | POA: Diagnosis not present

## 2019-10-11 ENCOUNTER — Ambulatory Visit (INDEPENDENT_AMBULATORY_CARE_PROVIDER_SITE_OTHER): Payer: BC Managed Care – PPO | Admitting: Pulmonary Disease

## 2019-10-11 ENCOUNTER — Encounter: Payer: Self-pay | Admitting: Pulmonary Disease

## 2019-10-11 DIAGNOSIS — J301 Allergic rhinitis due to pollen: Secondary | ICD-10-CM | POA: Diagnosis not present

## 2019-10-11 DIAGNOSIS — J40 Bronchitis, not specified as acute or chronic: Secondary | ICD-10-CM

## 2019-10-11 DIAGNOSIS — J324 Chronic pansinusitis: Secondary | ICD-10-CM

## 2019-10-11 DIAGNOSIS — J45991 Cough variant asthma: Secondary | ICD-10-CM | POA: Diagnosis not present

## 2019-10-11 DIAGNOSIS — D7218 Eosinophilia in diseases classified elsewhere: Secondary | ICD-10-CM | POA: Diagnosis not present

## 2019-10-11 NOTE — Progress Notes (Signed)
   Subjective:    Patient ID: Jackson Matthews, male    DOB: 1965/11/13, 54 y.o.   MRN: [id number]  Virtual Visit Via Video or Telephone Note:   This visit type was conducted due to national recommendations for restrictions regarding the COVID-19 pandemic .  This format is felt to be most appropriate for this patient at this time.  All issues noted in this document were discussed and addressed.  No physical exam was performed (except for noted visual exam findings with Video Visits).    I connected with Jackson Matthews by telephone at 1535 hrs. and verified that I was speaking with the correct person using two identifiers. Location patient: home Location provider: [facility] Persons participating in the virtual visit: patient, physician   I discussed the limitations, risks, security and privacy concerns of performing an evaluation and management service by telephone and the availability of in person appointments. The patient expressed understanding and agreed to proceed.   HPI 54 year old lifelong never smoker following up on the issue of chronic cough.  Please refer to the most recent visit on 29 August 2019.  Patient notes that cough is steroid responsive.  He has chronic pansinusitis with frequent flares and issues with perennial nonallergic rhinitis that trigger upper airway cough syndrome.  In addition he appears to have issues with either cough variant asthma or eosinophilic bronchitis.  During his last visit we switched his Spiriva to QVAR 80 mcg 2 puffs twice a day.  He also received prednisone taper.  He notes that he is now "85 to 90% better" with regards to his cough.  He has not had any sputum production or hemoptysis.  No dyspnea.  No chest pain.  Overall doing markedly better. He is to see ENT in the upcoming week.  He is following nasal hygiene.  He had extensive ENT surgery in 2018.   Review of Systems A 10 point review of systems was performed and it is  as noted above otherwise negative.    Objective:   Physical Exam  No physical exam performed as this was via telephone visit.      Assessment & Plan:   Cough variant asthma versus eosinophilic bronchitis Element of upper airway cough syndrome Improving on QVAR Continue QVAR 80 mcg, 2 inhalations twice a day Continue as needed albuterol  Chronic pansinusitis with chronic nonallergic rhinitis Follows with ENT Continue nasal hygiene as prescribed by ENT   Follow-up in a month's time call sooner should any new difficulties arise.  His next visit should be an in person visit.   Total visit time was 11 minutes.  Gailen Shelter, MD [facility]   *This note was dictated using voice recognition software/Dragon.  Despite best efforts to proofread, errors can occur which can change the meaning.  Any change was purely unintentional.

## 2019-10-11 NOTE — Patient Instructions (Addendum)
1.  Continue Qvar 2 puffs twice a day.  Continue as needed albuterol.  2.  Follow-up in 3 months time call sooner should any new difficulties arise.  3.  Continue follow-up with ENT.

## 2019-10-15 DIAGNOSIS — J301 Allergic rhinitis due to pollen: Secondary | ICD-10-CM | POA: Diagnosis not present

## 2019-10-19 DIAGNOSIS — J301 Allergic rhinitis due to pollen: Secondary | ICD-10-CM | POA: Diagnosis not present

## 2019-10-22 ENCOUNTER — Other Ambulatory Visit: Payer: Self-pay

## 2019-10-22 DIAGNOSIS — J301 Allergic rhinitis due to pollen: Secondary | ICD-10-CM | POA: Diagnosis not present

## 2019-10-22 MED ORDER — QVAR REDIHALER 80 MCG/ACT IN AERB: 2.0000 | INHALATION_SPRAY | Freq: Two times a day (BID) | RESPIRATORY_TRACT | 3 refills | Status: DC

## 2019-10-25 DIAGNOSIS — J301 Allergic rhinitis due to pollen: Secondary | ICD-10-CM | POA: Diagnosis not present

## 2019-11-01 DIAGNOSIS — J301 Allergic rhinitis due to pollen: Secondary | ICD-10-CM | POA: Diagnosis not present

## 2019-11-08 DIAGNOSIS — J301 Allergic rhinitis due to pollen: Secondary | ICD-10-CM | POA: Diagnosis not present

## 2019-11-12 DIAGNOSIS — J301 Allergic rhinitis due to pollen: Secondary | ICD-10-CM | POA: Diagnosis not present

## 2019-11-15 DIAGNOSIS — J301 Allergic rhinitis due to pollen: Secondary | ICD-10-CM | POA: Diagnosis not present

## 2019-11-16 DIAGNOSIS — J301 Allergic rhinitis due to pollen: Secondary | ICD-10-CM | POA: Diagnosis not present

## 2019-11-16 DIAGNOSIS — J329 Chronic sinusitis, unspecified: Secondary | ICD-10-CM | POA: Diagnosis not present

## 2019-11-19 DIAGNOSIS — J301 Allergic rhinitis due to pollen: Secondary | ICD-10-CM | POA: Diagnosis not present

## 2019-11-20 DIAGNOSIS — J301 Allergic rhinitis due to pollen: Secondary | ICD-10-CM | POA: Diagnosis not present

## 2019-11-22 DIAGNOSIS — J301 Allergic rhinitis due to pollen: Secondary | ICD-10-CM | POA: Diagnosis not present

## 2019-11-26 DIAGNOSIS — J301 Allergic rhinitis due to pollen: Secondary | ICD-10-CM | POA: Diagnosis not present

## 2019-11-30 DIAGNOSIS — J301 Allergic rhinitis due to pollen: Secondary | ICD-10-CM | POA: Diagnosis not present

## 2019-12-09 ENCOUNTER — Other Ambulatory Visit: Payer: Self-pay | Admitting: Pulmonary Disease

## 2019-12-10 DIAGNOSIS — J301 Allergic rhinitis due to pollen: Secondary | ICD-10-CM | POA: Diagnosis not present

## 2019-12-13 DIAGNOSIS — J301 Allergic rhinitis due to pollen: Secondary | ICD-10-CM | POA: Diagnosis not present

## 2019-12-17 DIAGNOSIS — J301 Allergic rhinitis due to pollen: Secondary | ICD-10-CM | POA: Diagnosis not present

## 2019-12-20 DIAGNOSIS — J301 Allergic rhinitis due to pollen: Secondary | ICD-10-CM | POA: Diagnosis not present

## 2019-12-24 DIAGNOSIS — J301 Allergic rhinitis due to pollen: Secondary | ICD-10-CM | POA: Diagnosis not present

## 2019-12-27 DIAGNOSIS — J301 Allergic rhinitis due to pollen: Secondary | ICD-10-CM | POA: Diagnosis not present

## 2020-02-21 ENCOUNTER — Ambulatory Visit: Payer: BC Managed Care – PPO | Admitting: Pulmonary Disease

## 2020-04-29 DIAGNOSIS — J019 Acute sinusitis, unspecified: Secondary | ICD-10-CM | POA: Diagnosis not present

## 2020-05-21 ENCOUNTER — Other Ambulatory Visit: Payer: Self-pay | Admitting: Pulmonary Disease

## 2020-06-26 DIAGNOSIS — J329 Chronic sinusitis, unspecified: Secondary | ICD-10-CM | POA: Diagnosis not present

## 2020-07-15 DIAGNOSIS — J018 Other acute sinusitis: Secondary | ICD-10-CM | POA: Diagnosis not present

## 2020-07-21 ENCOUNTER — Encounter: Payer: Self-pay | Admitting: *Deleted

## 2020-07-21 ENCOUNTER — Emergency Department
Admission: EM | Admit: 2020-07-21 | Discharge: 2020-07-22 | Disposition: A | Discharge status: Home / Self Care | Payer: BC Managed Care – PPO | Attending: Emergency Medicine | Admitting: Emergency Medicine

## 2020-07-21 ENCOUNTER — Other Ambulatory Visit: Payer: Self-pay

## 2020-07-21 DIAGNOSIS — Z20822 Contact with and (suspected) exposure to covid-19: Secondary | ICD-10-CM | POA: Diagnosis not present

## 2020-07-21 DIAGNOSIS — R519 Headache, unspecified: Secondary | ICD-10-CM

## 2020-07-21 DIAGNOSIS — R112 Nausea with vomiting, unspecified: Secondary | ICD-10-CM | POA: Diagnosis not present

## 2020-07-21 DIAGNOSIS — U071 COVID-19: Secondary | ICD-10-CM

## 2020-07-21 DIAGNOSIS — J189 Pneumonia, unspecified organism: Secondary | ICD-10-CM | POA: Diagnosis not present

## 2020-07-21 DIAGNOSIS — Z03818 Encounter for observation for suspected exposure to other biological agents ruled out: Secondary | ICD-10-CM | POA: Diagnosis not present

## 2020-07-21 DIAGNOSIS — R531 Weakness: Secondary | ICD-10-CM | POA: Diagnosis not present

## 2020-07-21 DIAGNOSIS — J1282 Pneumonia due to coronavirus disease 2019: Secondary | ICD-10-CM | POA: Diagnosis not present

## 2020-07-21 DIAGNOSIS — R9431 Abnormal electrocardiogram [ECG] [EKG]: Secondary | ICD-10-CM | POA: Diagnosis not present

## 2020-07-21 LAB — COMPREHENSIVE METABOLIC PANEL
ALT: 80 U/L — ABNORMAL HIGH (ref 0–44)
AST: 100 U/L — ABNORMAL HIGH (ref 15–41)
Albumin: 3.9 g/dL (ref 3.5–5.0)
Alkaline Phosphatase: 60 U/L (ref 38–126)
Anion gap: 11 (ref 5–15)
BUN: 17 mg/dL (ref 6–20)
CO2: 24 mmol/L (ref 22–32)
Calcium: 7.9 mg/dL — ABNORMAL LOW (ref 8.9–10.3)
Chloride: 99 mmol/L (ref 98–111)
Creatinine, Ser: 0.97 mg/dL (ref 0.61–1.24)
GFR, Estimated: >60 mL/min (ref >60)
Glucose, Bld: 115 mg/dL — ABNORMAL HIGH (ref 70–99)
Potassium: 3.5 mmol/L (ref 3.5–5.1)
Sodium: 134 mmol/L — ABNORMAL LOW (ref 135–145)
Total Bilirubin: 0.8 mg/dL (ref 0.3–1.2)
Total Protein: 7.2 g/dL (ref 6.5–8.1)

## 2020-07-21 LAB — CBC
HCT: 49.5 % (ref 39.0–52.0)
Hemoglobin: 16.8 g/dL (ref 13.0–17.0)
MCH: 29.7 pg (ref 26.0–34.0)
MCHC: 33.9 g/dL (ref 30.0–36.0)
MCV: 87.5 fL (ref 80.0–100.0)
Platelets: 97 10*3/uL — ABNORMAL LOW (ref 150–400)
RBC: 5.66 MIL/uL (ref 4.22–5.81)
RDW: 13.2 % (ref 11.5–15.5)
WBC: 3.6 10*3/uL — ABNORMAL LOW (ref 4.0–10.5)
nRBC: 0 % (ref 0.0–0.2)

## 2020-07-21 LAB — LIPASE, BLOOD: Lipase: 26 U/L (ref 11–51)

## 2020-07-21 MED ORDER — ONDANSETRON 4 MG PO TBDP
4.0000 mg | ORAL_TABLET | Freq: Once | ORAL | Status: AC | PRN
  Administered 2020-07-21: 4 mg via ORAL
  Filled 2020-07-21: qty 1

## 2020-07-21 NOTE — ED Provider Notes (Signed)
[facility] Emergency Department Provider Note   ____________________________________________   First MD Initiated Contact with Patient 07/21/20 2345     (approximate)  I have reviewed the triage vital signs and the nursing notes.   HISTORY  Chief Complaint Emesis and Headache    HPI Jackson Matthews is a 54 y.o. male who presents to the ED from home with a chief complaint of headache, nausea and vomiting.  Patient with a history of chronic sinusitis, kidney stones who reports pain in his frontal sinus and behind his eyes x3 days.  Associated with nausea and vomiting.  Denies associated photophobia, vision changes, neck pain, slurred speech, confusion, extremity weakness, numbness or tingling.  Denies fever, chills, cough, chest pain, shortness of breath, abdominal pain, dysuria, diarrhea.  Denies recent travel or trauma.  Patient is unvaccinated against COVID-19.     Past Medical History:  Diagnosis Date  . Kidney stones   . Sinusitis, chronic     There are no problems to display for this patient.   Past Surgical History:  Procedure Laterality Date  . ETHMOIDECTOMY Bilateral 04/19/2017   Procedure: ETHMOIDECTOMY RIGHT ANTERIOR / LEFT TOTAL ETHMOIDECTOMY;  Surgeon: Geanie LoganBennett, Paul, MD;  [facility];  Service: ENT;  Laterality: Bilateral;  . 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 FRONTAL SINUS EXPLORATION Bilateral 04/19/2017   Procedure: FRONTAL SINUS EXPLORATION;  Surgeon: Geanie LoganBennett, Paul, MD;  [facility];  Service: ENT;  Laterality: Bilateral;  . IMAGE GUIDED SINUS SURGERY N/A 04/19/2017   Procedure: IMAGE GUIDED SINUS SURGERY;  Surgeon: Geanie LoganBennett, Paul, MD;  [facility];  Service: ENT;  Laterality: N/A;    LEFT SPHENOIDECTOMY NOT PERFORMED  POLYPS WERE BLOCKING THE OPENING AND WHEN POLYPS WERE REMOVED THE SINUS CAVITY WAS OPEN   NEED DISK GAVE DISK TO CECE 6-6- KP  . MAXILLARY ANTROSTOMY Bilateral 04/19/2017   Procedure: MAXILLARY ANTROSTOMY;  Surgeon: Geanie LoganBennett, Paul, MD;  [facility];  Service: ENT;  Laterality: Bilateral;  . POLYPECTOMY Left 04/19/2017   Procedure: POLYPECTOMY NASAL ENDOSCOPY LEFT;  Surgeon: Geanie LoganBennett, Paul, MD;  [facility];  Service: ENT;  Laterality: Left;  . SEPTOPLASTY N/A 05/19/2017   Procedure: POST OP SINUS BLEED;  Surgeon: Bud FaceVaught, Creighton, MD;  Location: [facility];  Service: ENT;  Laterality: N/A;  . TURBINATE REDUCTION Bilateral 04/19/2017   Procedure: TURBINATE COBLATION;  Surgeon: Geanie LoganBennett, Paul, MD;  [facility];  Service: ENT;  Laterality: Bilateral;    Prior to Admission medications   Medication Sig Start Date End Date Taking? Authorizing Provider  albuterol (VENTOLIN HFA) 108 (90 Base) MCG/ACT inhaler Inhale 2 puffs into the lungs every 6 (six) hours as needed for wheezing or shortness of breath (Cough). 07/22/20   Irean HongSung, Marcques Wrightsman J, MD  Azelastine HCl 137 MCG/SPRAY SOLN Place 2 sprays into both nostrils 2 (two) times daily. 04/22/19   [provider]  beclomethasone (QVAR REDIHALER) 80 MCG/ACT inhaler Inhale 2 puffs into the lungs 2 (two) times daily. 10/22/19   Salena SanerGonzalez, Carmen L, MD  chlorpheniramine-HYDROcodone [facility](TUSSIONEX PENNKINETIC ER) 10-8 MG/5ML SUER Take 5 mLs by mouth 2 (two) times daily. 07/22/20   Irean HongSung, Treyshon Buchanon J, MD  EPINEPHrine 0.3 mg/0.3 mL IJ SOAJ injection See admin instructions. 02/16/19   [provider]  ibuprofen (ADVIL) 200 MG tablet Take  by mouth.    [provider]  omeprazole (PRILOSEC) 40 MG capsule TAKE ONE CAPSULE BY MOUTH ONCE DAILY, 30 MINUTES PRIOR TO EVENING MEAL. 03/09/19   [provider]  predniSONE (DELTASONE) 50 MG tablet Take 1 tablet (50 mg total) by mouth daily for 4 days. 07/22/20  07/26/20  Irean Hong, MD    Allergies Patient has no known allergies.  No family history on file.  Social History Social History   Tobacco Use  . Smoking status: Never Smoker  . Smokeless tobacco: Never Used  Vaping Use  . Vaping Use: Never used  Substance Use Topics  . Alcohol use: Not Currently    Comment: occ., 1x/mo  . Drug use: No    Review of Systems  Constitutional: No fever/chills Eyes: No visual changes. ENT: No sore throat. Cardiovascular: Denies chest pain. Respiratory: Denies shortness of breath. Gastrointestinal: No abdominal pain.  Positive for nausea and vomiting.  No diarrhea.  No constipation. Genitourinary: Negative for dysuria. Musculoskeletal: Negative for back pain. Skin: Negative for rash. Neurological: Positive for headache.  Negative for focal weakness or numbness.   ____________________________________________   PHYSICAL EXAM:  VITAL SIGNS: ED Triage Vitals  Enc Vitals Group     BP 07/21/20 2029 128/79     Pulse Rate 07/21/20 2029 96     Resp 07/21/20 2029 20     Temp 07/21/20 2029 99.6 F (37.6 C)     Temp Source 07/21/20 2029 Oral     SpO2 07/21/20 2029 95 %     Weight 07/21/20 2030 222 lb (100.7 kg)     Height 07/21/20 2030 6' (1.829 m)     Head Circumference --      Peak Flow --      Pain Score 07/21/20 2030 10     Pain Loc --      Pain Edu? --      Excl. in GC? --     Constitutional: Alert and oriented. Well appearing and in mild acute distress. Eyes: Conjunctivae are normal. PERRL. EOMI. Head: Atraumatic.  Frontal sinus tender to palpation. Ears: Slight fluid behind both TMs, otherwise unremarkable. Nose: No congestion/rhinnorhea. Mouth/Throat: Mucous membranes are moist.  Oropharynx non-erythematous. Neck: No stridor.  No cervical spine tenderness to palpation.  No carotid bruits.  Supple neck without meningismus. Cardiovascular: Normal rate, regular rhythm. Grossly normal heart sounds.  Good peripheral  circulation. Respiratory: Normal respiratory effort.  No retractions. Lungs CTAB. Gastrointestinal: Soft and nontender to light or deep palpation. No distention. No abdominal bruits. No CVA tenderness. Musculoskeletal: No lower extremity tenderness nor edema.  No joint effusions. Neurologic:  Normal speech and language. No gross focal neurologic deficits are appreciated. No gait instability. Skin:  Skin is warm, dry and intact. No rash noted.  No petechiae.  No vesicles. Psychiatric: Mood and affect are normal. Speech and behavior are normal.  ____________________________________________   LABS (all labs ordered are listed, but only abnormal results are displayed)  Labs Reviewed  RESPIRATORY PANEL BY RT PCR (FLU A&B, COVID) - Abnormal; Notable for the following components:      Result Value   SARS Coronavirus 2 by RT PCR POSITIVE (*)    All other components within normal limits  COMPREHENSIVE METABOLIC PANEL - Abnormal; Notable for the following components:   Sodium 134 (*)    Glucose, Bld 115 (*)    Calcium 7.9 (*)    AST 100 (*)    ALT 80 (*)  All other components within normal limits  CBC - Abnormal; Notable for the following components:   WBC 3.6 (*)    Platelets 97 (*)    All other components within normal limits  LIPASE, BLOOD  TROPONIN I (HIGH SENSITIVITY)   ____________________________________________  EKG  None ____________________________________________  RADIOLOGY I, Brittainy Bucker J, personally viewed and evaluated these images (plain radiographs) as part of my medical decision making, as well as reviewing the written report by the radiologist.  ED MD interpretation: CT head demonstrates no ICH; chest x-ray consistent with COVID-19 pneumonia.  Official radiology report(s): CT Head Wo Contrast  Result Date: 07/22/2020 CLINICAL DATA:  Headache EXAM: CT HEAD WITHOUT CONTRAST TECHNIQUE: Contiguous axial images were obtained from the base of the skull through the  vertex without intravenous contrast. COMPARISON:  03/11/2015 FINDINGS: Brain: There is no mass, hemorrhage or extra-axial collection. The size and configuration of the ventricles and extra-axial CSF spaces are normal. The brain parenchyma is normal, without acute or chronic infarction. Vascular: No abnormal hyperdensity of the major intracranial arteries or dural venous sinuses. No intracranial atherosclerosis. Skull: The visualized skull base, calvarium and extracranial soft tissues are normal. Sinuses/Orbits: No fluid levels or advanced mucosal thickening of the visualized paranasal sinuses. No mastoid or middle ear effusion. The orbits are normal. IMPRESSION: Normal head CT. Electronically Signed   By: Deatra Robinson M.D.   On: 07/22/2020 00:42   DG Chest Port 1 View  Result Date: 07/22/2020 CLINICAL DATA:  Weakness.  COVID positive. EXAM: PORTABLE CHEST 1 VIEW COMPARISON:  05/23/2019 FINDINGS: Streaky subsegmental opacities at both lung bases. Stable heart size with unchanged mediastinal contours. No evidence of pneumomediastinum. No pleural fluid or pneumothorax. No pulmonary edema. No acute osseous abnormalities are seen. IMPRESSION: Streaky bibasilar opacities. This may represent atelectasis or pneumonia in the setting of COVID 19. Electronically Signed   By: Narda Rutherford M.D.   On: 07/22/2020 03:05    ____________________________________________   PROCEDURES  Procedure(s) performed (including Critical Care):  .1-3 Lead EKG Interpretation Performed by: Irean Hong, MD Authorized by: Irean Hong, MD     Interpretation: normal     ECG rate:  85   ECG rate assessment: normal     Rhythm: sinus rhythm     Ectopy: none     Conduction: normal   Comments:     Placed on cardiac monitor to evaluate for arrhythmias     ____________________________________________   INITIAL IMPRESSION / ASSESSMENT AND PLAN / ED COURSE  As part of my medical decision making, I reviewed the  following data within the electronic MEDICAL RECORD NUMBER History obtained from family, Nursing notes reviewed and incorporated, Labs reviewed, Old chart reviewed, Radiograph reviewed and Notes from prior ED visits     54 year old male presenting with frontal headache, nausea and vomiting. Differential diagnosis includes, but is not limited to, intracranial hemorrhage, meningitis/encephalitis, previous head trauma, cavernous venous thrombosis, tension headache, temporal arteritis, migraine or migraine equivalent, idiopathic intracranial hypertension, and non-specific headache.  Laboratory results unremarkable other than mild thrombocytopenia and minimally elevated transaminases with normal bilirubin.  Will check troponin, respiratory panel.  Obtain CT head.  Administer IV fluids, IV Compazine/Benadryl and reassess.   Clinical Course as of Jul 22 309  Tue Jul 22, 2020  0032 Updated patient on positive Covid results.  Will perform ambulation trial and chest x-ray.  Did offer IV antibiotic infusion if patient is able to be discharged.  Patient declines emphatically.   [JS]  0217 Patient resting in no acute distress, 0/10 pain.  Awaiting results of added troponin and respiratory panel.   [JS]  0254 Room air saturations 95% on ambulation.   [JS]  0308 Updated patient and family member on chest x-ray results.  Instructions for quarantine given.  Will discharge home with prednisone, albuterol inhaler and Tussionex to use as needed.  Strict return precautions given.  Patient verbalizes understanding and agrees with plan of care.   [JS]    Clinical Course User Index [JS] Irean Hong, MD     ____________________________________________   FINAL CLINICAL IMPRESSION(S) / ED DIAGNOSES  Final diagnoses:  Acute nonintractable headache, unspecified headache type  Non-intractable vomiting with nausea, unspecified vomiting type  COVID-19  Pneumonia due to COVID-19 virus     ED Discharge Orders          Ordered    predniSONE (DELTASONE) 50 MG tablet  Daily        07/22/20 0300    albuterol (VENTOLIN HFA) 108 (90 Base) MCG/ACT inhaler  Every 6 hours PRN        07/22/20 0300    chlorpheniramine-HYDROcodone (TUSSIONEX PENNKINETIC ER) 10-8 MG/5ML SUER  2 times daily        07/22/20 0300          *Please note:  Jackson Matthews was evaluated in Emergency Department on 07/22/2020 for the symptoms described in the history of present illness. He was evaluated in the context of the global COVID-19 pandemic, which necessitated consideration that the patient might be at risk for infection with the SARS-CoV-2 virus that causes COVID-19. Institutional protocols and algorithms that pertain to the evaluation of patients at risk for COVID-19 are in a state of rapid change based on information released by regulatory bodies including the CDC and federal and state organizations. These policies and algorithms were followed during the patient's care in the ED.  Some ED evaluations and interventions may be delayed as a result of limited staffing during and the pandemic.*   Note:  This document was prepared using Dragon voice recognition software and may include unintentional dictation errors.   Irean Hong, MD 07/22/20 [phone]

## 2020-07-21 NOTE — ED Triage Notes (Signed)
Pt reports vomiting for 3 days.  No diarrhea.  No abd pain.  No back pain.  Pt reports a headache  Pt taking tylenol without relief.    Pt alert  Speech clear.

## 2020-07-22 ENCOUNTER — Encounter: Payer: Self-pay | Admitting: Radiology

## 2020-07-22 ENCOUNTER — Emergency Department: Payer: BC Managed Care – PPO

## 2020-07-22 DIAGNOSIS — U071 COVID-19: Secondary | ICD-10-CM | POA: Diagnosis not present

## 2020-07-22 DIAGNOSIS — R531 Weakness: Secondary | ICD-10-CM | POA: Diagnosis not present

## 2020-07-22 DIAGNOSIS — J189 Pneumonia, unspecified organism: Secondary | ICD-10-CM | POA: Diagnosis not present

## 2020-07-22 DIAGNOSIS — R519 Headache, unspecified: Secondary | ICD-10-CM | POA: Diagnosis not present

## 2020-07-22 LAB — RESPIRATORY PANEL BY RT PCR (FLU A&B, COVID)
Influenza A by PCR: NEGATIVE
Influenza B by PCR: NEGATIVE
SARS Coronavirus 2 by RT PCR: POSITIVE — AB

## 2020-07-22 LAB — TROPONIN I (HIGH SENSITIVITY): Troponin I (High Sensitivity): 7 ng/L (ref <18)

## 2020-07-22 MED ORDER — DIPHENHYDRAMINE HCL 50 MG/ML IJ SOLN
25.0000 mg | Freq: Once | INTRAMUSCULAR | Status: AC
  Administered 2020-07-22: 25 mg via INTRAVENOUS
  Filled 2020-07-22: qty 1

## 2020-07-22 MED ORDER — ALBUTEROL SULFATE HFA 108 (90 BASE) MCG/ACT IN AERS: 2.0000 | INHALATION_SPRAY | Freq: Four times a day (QID) | RESPIRATORY_TRACT | 0 refills | Status: DC | PRN

## 2020-07-22 MED ORDER — HYDROCOD POLST-CPM POLST ER 10-8 MG/5ML PO SUER: 5.0000 mL | Freq: Two times a day (BID) | ORAL | 0 refills | Status: DC

## 2020-07-22 MED ORDER — PREDNISONE 50 MG PO TABS: 50.0000 mg | ORAL_TABLET | Freq: Every day | ORAL | 0 refills | Status: AC

## 2020-07-22 MED ORDER — DEXTROSE-NACL 5-0.45 % IV SOLN: Freq: Once | INTRAVENOUS | Status: AC

## 2020-07-22 MED ORDER — METHYLPREDNISOLONE SODIUM SUCC 125 MG IJ SOLR
125.0000 mg | Freq: Once | INTRAMUSCULAR | Status: AC
  Administered 2020-07-22: 125 mg via INTRAVENOUS
  Filled 2020-07-22: qty 2

## 2020-07-22 MED ORDER — PROCHLORPERAZINE EDISYLATE 10 MG/2ML IJ SOLN
5.0000 mg | Freq: Once | INTRAMUSCULAR | Status: AC
  Administered 2020-07-22: 5 mg via INTRAVENOUS
  Filled 2020-07-22: qty 2

## 2020-07-22 NOTE — ED Notes (Signed)
Pt given ginger ale at this time.  

## 2020-07-22 NOTE — Discharge Instructions (Signed)
You should quarantine for 10 days from the onset of your symptoms. Take steroid as prescribed (Prednisone 50mg  daily until finished).  Start your next dose Wednesday morning. You may take Tussionex as needed for cough. You may use Albuterol inhaler 2 puffs every 4 hours as needed for difficulty breathing. Alternate Tylenol and ibuprofen as needed for fever/body aches. Return to the ER for worsening symptoms, persistent vomiting, difficulty breathing or other concerns.

## 2020-07-22 NOTE — ED Notes (Signed)
pts ambulatory sats 96%, pt in no distress

## 2020-07-23 ENCOUNTER — Telehealth: Payer: Self-pay | Admitting: Nurse Practitioner

## 2020-07-23 NOTE — Telephone Encounter (Signed)
Called to Discuss with patient about Covid symptoms and the use of the monoclonal antibody infusion for those with mild to moderate Covid symptoms and at a high risk of hospitalization.     Pt appears to qualify for this infusion due to co-morbid conditions and/or a member of an at-risk group in accordance with the FDA Emergency Use Authorization. BMI >25/asthma. Of note patient declined infusion on 07/22/20 in the ED.    Unable to reach pt. Voicemail left.   Willette Alma, NP [facility]  [phone]

## 2020-07-25 DIAGNOSIS — Z1389 Encounter for screening for other disorder: Secondary | ICD-10-CM | POA: Diagnosis not present

## 2020-07-31 ENCOUNTER — Ambulatory Visit
Admission: EM | Admit: 2020-07-31 | Discharge: 2020-07-31 | Disposition: A | Discharge status: Home / Self Care | Payer: BC Managed Care – PPO | Attending: Family Medicine | Admitting: Family Medicine

## 2020-07-31 ENCOUNTER — Ambulatory Visit (INDEPENDENT_AMBULATORY_CARE_PROVIDER_SITE_OTHER): Payer: BC Managed Care – PPO

## 2020-07-31 DIAGNOSIS — R5383 Other fatigue: Secondary | ICD-10-CM

## 2020-07-31 DIAGNOSIS — U071 COVID-19: Secondary | ICD-10-CM

## 2020-07-31 DIAGNOSIS — R0602 Shortness of breath: Secondary | ICD-10-CM | POA: Diagnosis not present

## 2020-07-31 DIAGNOSIS — R059 Cough, unspecified: Secondary | ICD-10-CM

## 2020-07-31 DIAGNOSIS — J1282 Pneumonia due to coronavirus disease 2019: Secondary | ICD-10-CM | POA: Diagnosis not present

## 2020-07-31 MED ORDER — ALBUTEROL SULFATE HFA 108 (90 BASE) MCG/ACT IN AERS: 2.0000 | INHALATION_SPRAY | RESPIRATORY_TRACT | 0 refills | Status: DC | PRN

## 2020-07-31 MED ORDER — HYDROCOD POLST-CPM POLST ER 10-8 MG/5ML PO SUER: 5.0000 mL | Freq: Two times a day (BID) | ORAL | 0 refills | Status: DC | PRN

## 2020-07-31 MED ORDER — PREDNISONE 10 MG (21) PO TBPK: ORAL_TABLET | Freq: Every day | ORAL | 0 refills | Status: DC

## 2020-07-31 MED ORDER — AZITHROMYCIN 250 MG PO TABS: 250.0000 mg | ORAL_TABLET | Freq: Every day | ORAL | 0 refills | Status: DC

## 2020-07-31 NOTE — ED Provider Notes (Signed)
[facility]   [id number] 07/31/20 Arrival Time: 1421   CC: COVID symptoms  SUBJECTIVE: History from: patient.  Jackson Matthews is a 53 y.o. male who presents with positive Covid and known Covid pneumonia. Was seen in [facility] for this and diagnosed with pneumonia. Has positive history of Covid. Has not completed Covid vaccines. Has not taken OTC medications for this. There are no aggravating or alleviating factors. Denies previous symptoms in the past. Denies fever, chills, sinus pain, rhinorrhea, sore throat, wheezing, chest pain, nausea, changes in bowel or bladder habits.    ROS: As per HPI.  All other pertinent ROS negative.     Past Medical History:  Diagnosis Date  . Kidney stones   . Sinusitis, chronic    Past Surgical History:  Procedure Laterality Date  . ETHMOIDECTOMY Bilateral 04/19/2017   Procedure: ETHMOIDECTOMY RIGHT ANTERIOR / LEFT TOTAL ETHMOIDECTOMY;  Surgeon: Geanie Logan, MD;  [facility];  Service: ENT;  Laterality: Bilateral;  . 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 FRONTAL SINUS EXPLORATION Bilateral 04/19/2017   Procedure: FRONTAL SINUS EXPLORATION;  Surgeon: Geanie Logan, MD;  Location: [facility];  Service: ENT;  Laterality: Bilateral;  . IMAGE GUIDED SINUS SURGERY N/A 04/19/2017   Procedure: IMAGE GUIDED SINUS SURGERY;  Surgeon: Geanie Logan, MD;  Location: [facility];  Service: ENT;  Laterality: N/A;    LEFT SPHENOIDECTOMY NOT PERFORMED POLYPS WERE BLOCKING THE OPENING AND WHEN POLYPS WERE REMOVED THE SINUS CAVITY WAS OPEN   NEED DISK GAVE DISK TO CECE 6-6- KP  . MAXILLARY ANTROSTOMY Bilateral 04/19/2017   Procedure: MAXILLARY ANTROSTOMY;  Surgeon: Geanie Logan, MD;  Location: [facility];  Service: ENT;  Laterality: Bilateral;  .  POLYPECTOMY Left 04/19/2017   Procedure: POLYPECTOMY NASAL ENDOSCOPY LEFT;  Surgeon: Geanie Logan, MD;  Location: [facility];  Service: ENT;  Laterality: Left;  . SEPTOPLASTY N/A 05/19/2017   Procedure: POST OP SINUS BLEED;  Surgeon: Bud Face, MD;  Location: [facility];  Service: ENT;  Laterality: N/A;  . TURBINATE REDUCTION Bilateral 04/19/2017   Procedure: TURBINATE COBLATION;  Surgeon: Geanie Logan, MD;  Location: [facility];  Service: ENT;  Laterality: Bilateral;   No Known Allergies No current facility-administered medications on file prior to encounter.   Current Outpatient Medications on File Prior to Encounter  Medication Sig Dispense Refill  . Azelastine HCl 137 MCG/SPRAY SOLN Place 2 sprays into both nostrils 2 (two) times daily.    . beclomethasone (QVAR REDIHALER) 80 MCG/ACT inhaler Inhale 2 puffs into the lungs 2 (two) times daily. 10.6 g 3  . EPINEPHrine 0.3 mg/0.3 mL IJ SOAJ injection See admin instructions.    Marland Kitchen ibuprofen (ADVIL) 200 MG tablet Take by mouth.    Marland Kitchen omeprazole (PRILOSEC) 40 MG capsule TAKE ONE CAPSULE BY MOUTH ONCE DAILY, 30 MINUTES PRIOR TO EVENING MEAL.     Social History   Socioeconomic History  . Marital status: Married    Spouse name: Not on file  . Number of children: Not on file  . Years of education: Not on file  . Highest education level: Not on file  Occupational History  . Not on file  Tobacco Use  . Smoking status: Never Smoker  . Smokeless tobacco: Never Used  Vaping Use  . Vaping Use: Never used  Substance and  Sexual Activity  . Alcohol use: Not Currently    Comment: occ., 1x/mo  . Drug use: No  . Sexual activity: Not on file  Other Topics Concern  . Not on file  Social History Narrative  . Not on file   Social Determinants of Health   Financial Resource Strain:   . Difficulty of Paying Living Expenses: Not on file  Food Insecurity:   . Worried About Programme researcher, broadcasting/film/video in the Last Year: Not on file    . Ran Out of Food in the Last Year: Not on file  Transportation Needs:   . Lack of Transportation (Medical): Not on file  . Lack of Transportation (Non-Medical): Not on file  Physical Activity:   . Days of Exercise per Week: Not on file  . Minutes of Exercise per Session: Not on file  Stress:   . Feeling of Stress : Not on file  Social Connections:   . Frequency of Communication with Friends and Family: Not on file  . Frequency of Social Gatherings with Friends and Family: Not on file  . Attends Religious Services: Not on file  . Active Member of Clubs or Organizations: Not on file  . Attends Banker Meetings: Not on file  . Marital Status: Not on file  Intimate Partner Violence:   . Fear of Current or Ex-Partner: Not on file  . Emotionally Abused: Not on file  . Physically Abused: Not on file  . Sexually Abused: Not on file   No family history on file.  OBJECTIVE:  Vitals:   07/31/20 1434 07/31/20 1435  BP: 123/88   Pulse: (!) 122   Resp: 18   Temp: 98.8 F (37.1 C)   TempSrc: Oral   SpO2: 93%   Weight:  204 lb (92.5 kg)  Height:  6' (1.829 m)     General appearance: alert; appears fatigued, but nontoxic; speaking in full sentences and tolerating own secretions HEENT: NCAT; Ears: EACs clear, TMs pearly gray; Eyes: PERRL.  EOM grossly intact. Sinuses: nontender; Nose: nares patent without rhinorrhea, Throat: oropharynx clear, tonsils non erythematous or enlarged, uvula midline  Neck: supple without LAD Lungs: unlabored respirations, symmetrical air entry; cough: moderate; no respiratory distress; coarse lung sounds throughout bilateral lobes Heart: regular rate and rhythm.  Radial pulses 2+ symmetrical bilaterally Skin: warm and dry Psychological: alert and cooperative; normal mood and affect  LABS:  No results found for this or any previous visit (from the past 24 hour(s)).   ASSESSMENT & PLAN:  1. Pneumonia due to COVID-19 virus   2. COVID-19    3. Cough   4. SOB (shortness of breath)   5. Other fatigue     Meds ordered this encounter  Medications  . chlorpheniramine-HYDROcodone (TUSSIONEX PENNKINETIC ER) 10-8 MG/5ML SUER    Sig: Take 5 mLs by mouth every 12 (twelve) hours as needed for cough.    Dispense:  140 mL    Refill:  0    Order Specific Question:   Supervising Provider    Answer:   Merrilee Jansky [id number]  . predniSONE (STERAPRED UNI-PAK 21 TAB) 10 MG (21) TBPK tablet    Sig: Take by mouth daily. Take 6 tabs by mouth daily  for 2 days, then 5 tabs for 2 days, then 4 tabs for 2 days, then 3 tabs for 2 days, 2 tabs for 2 days, then 1 tab by mouth daily for 2 days    Dispense:  42 tablet  Refill:  0    Order Specific Question:   Supervising Provider    Answer:   Merrilee Jansky [id number]  . albuterol (VENTOLIN HFA) 108 (90 Base) MCG/ACT inhaler    Sig: Inhale 2 puffs into the lungs every 4 (four) hours as needed for wheezing or shortness of breath.    Dispense:  18 g    Refill:  0    Order Specific Question:   Supervising Provider    Answer:   Merrilee Jansky [id number]  . azithromycin (ZITHROMAX) 250 MG tablet    Sig: Take 1 tablet (250 mg total) by mouth daily. Take first 2 tablets together, then 1 every day until finished.    Dispense:  6 tablet    Refill:  0    Order Specific Question:   Supervising Provider    Answer:   Merrilee Jansky [[id number]]   CXR today shows increased patchy airspace opacities Prescribed azithromycin Prescribed steroid taper Prescribed albuterol inhaler Prescribed tussionex Recommend getting a pulse oximeter Go to the ER if O2 sats below 90 Get plenty of rest and push fluids Use OTC zyrtec for nasal congestion, runny nose, and/or sore throat Use OTC flonase for nasal congestion and runny nose Use medications daily for symptom relief Use OTC medications like ibuprofen or tylenol as needed fever or pain Call or go to the ED if you have any new or worsening symptoms such  as fever, worsening cough, shortness of breath, chest tightness, chest pain, turning blue, changes in mental status.  Reviewed expectations re: course of current medical issues. Questions answered. Outlined signs and symptoms indicating need for more acute intervention. Patient verbalized understanding. After Visit Summary given.         Moshe Cipro, NP 07/31/20 1533

## 2020-07-31 NOTE — Discharge Instructions (Addendum)
You still have pneumonia, slightly improved. This is going to take probably another 4 weeks to totally resolve.   I have sent in cough syrup for you to use twice a day as needed  I have sent in a steroid taper for you to take as well. Take 6 tablets for the first two days, take 5 tablets for day three and four, take 4 tablets for days five and six, take 3 tablets for days seven and eight, take 2 tablets for day nine and ten, then take 1 tablet for days eleven and twelve.  I have sent in an albuterol inhaler for you to use 2 puffs every 4-6 hours as needed for cough, shortness of breath, wheezing.  I have sent in azithromycin for you to take. Take 2 tablets today, then one tablet daily for the next 4 days.  Follow up with this office or with primary care if symptoms are persisting.  Follow up in the ER for high fever, trouble swallowing, trouble breathing, other concerning symptoms.

## 2020-07-31 NOTE — ED Triage Notes (Signed)
Pt was dx with covid on 10/25. Pt reports having an ongoing non productive cough. "I get winded sometimes when walking".

## 2020-08-20 DIAGNOSIS — J301 Allergic rhinitis due to pollen: Secondary | ICD-10-CM | POA: Diagnosis not present

## 2020-08-20 DIAGNOSIS — J0181 Other acute recurrent sinusitis: Secondary | ICD-10-CM | POA: Diagnosis not present

## 2020-08-26 ENCOUNTER — Other Ambulatory Visit: Payer: Self-pay

## 2020-08-26 ENCOUNTER — Ambulatory Visit: Payer: BC Managed Care – PPO

## 2020-08-26 ENCOUNTER — Encounter: Payer: Self-pay | Admitting: Pulmonary Disease

## 2020-08-26 ENCOUNTER — Ambulatory Visit: Payer: BC Managed Care – PPO | Admitting: Pulmonary Disease

## 2020-08-26 VITALS — BP 122/78 | HR 114 | Temp 97.7°F | Ht 72.0 in | Wt 229.4 lb

## 2020-08-26 DIAGNOSIS — J45991 Cough variant asthma: Secondary | ICD-10-CM

## 2020-08-26 DIAGNOSIS — Z8616 Personal history of COVID-19: Secondary | ICD-10-CM

## 2020-08-26 MED ORDER — SPIRIVA RESPIMAT 2.5 MCG/ACT IN AERS: 2.0000 | INHALATION_SPRAY | Freq: Every day | RESPIRATORY_TRACT | 0 refills | Status: DC

## 2020-08-26 MED ORDER — BENZONATATE 200 MG PO CAPS: 200.0000 mg | ORAL_CAPSULE | Freq: Three times a day (TID) | ORAL | 3 refills | Status: DC | PRN

## 2020-08-26 NOTE — Assessment & Plan Note (Addendum)
Plan: Resume Spiriva Respimat 2.5 Chest x-ray today Repeat CBC with diff and Ige, stable in 2020, but hx of nasal polyps with surgical removal Tessalon Perles prescribed today Obtain pulmonary function testing We will have patient establish with Dr. Sherene Sires in a 30-minute time slot

## 2020-08-26 NOTE — Addendum Note (Signed)
Addended by: Coral Ceo on: 08/26/2020 05:27 PM   Modules accepted: Orders

## 2020-08-26 NOTE — Assessment & Plan Note (Signed)
October/2021-tested positive for SARS-CoV-2 Did not require hospitalization Patient was unvaccinated COVID-19 Patient did not receive monoclonal antibody infusion  Plan:  Chest Xray today  PFTs ordered  Obtain COVID-19 vaccinations

## 2020-08-26 NOTE — Patient Instructions (Addendum)
You were seen today by Coral Ceo, NP  for:   1. Cough variant asthma  - benzonatate (TESSALON) 200 MG capsule; Take 1 capsule (200 mg total) by mouth 3 (three) times daily as needed for cough.  Dispense: 30 capsule; Refill: 3 - Pulmonary function test; Future  Cough Home Instructions:  We believe you have a chronic/cyclical cough that is aggravated by reflux , coughing , and drainage.  . Goal is to not Cough or clear throat.  Marland Kitchen Avoid coughing or clearing throat by using:  o non-mint products/sugarless candy o Water o ice chips o Remember NO MINT PRODUCTS  . Medications to use:  o Mucinex DM 1-2 every 12 hrs or Delsym 2 tsp every 12 hrs for cough (These are Over the counter) o Tessalon Three times a day  As needed  Cough.  o Prilosec 20mg  30 min before breakfast or dinner.  o Zyrtec 10mg  at bedtime (Can use generic, this is over the counter) o Chlor tabs 4mg  2 at bedtime  for nasal drip until cough is 100% cough free. (this medication is over the counter)   Please start taking chlorpheniramine (aka Chlor tabs) 4 mg tablet (1 to 2 tablets at night) for management of allergies and postnasal drip at night >>> This is an over-the-counter medication >>> This medication is sedating   2. History of COVID-19  - benzonatate (TESSALON) 200 MG capsule; Take 1 capsule (200 mg total) by mouth 3 (three) times daily as needed for cough.  Dispense: 30 capsule; Refill: 3 - DG Chest 2 View; Future - Pulmonary function test; Future   We recommend today:  Orders Placed This Encounter  Procedures  . DG Chest 2 View    Standing Status:   Future    Number of Occurrences:   1    Standing Expiration Date:   12/24/2020    Order Specific Question:   Reason for Exam (SYMPTOM  OR DIAGNOSIS REQUIRED)    Answer:   cough, post covid - 11/21    Order Specific Question:   Preferred imaging location?    Answer:   Internal    Order Specific Question:   Radiology Contrast Protocol - do NOT remove file path     Answer:   [email or web address]  . Pulmonary function test    Standing Status:   Future    Standing Expiration Date:   08/26/2021    Order Specific Question:   Where should this test be performed?    Answer:   [facility]   Orders Placed This Encounter  Procedures  . DG Chest 2 View  . Pulmonary function test   Meds ordered this encounter  Medications  . benzonatate (TESSALON) 200 MG capsule    Sig: Take 1 capsule (200 mg total) by mouth 3 (three) times daily as needed for cough.    Dispense:  30 capsule    Refill:  3  . Tiotropium Bromide Monohydrate (SPIRIVA RESPIMAT) 2.5 MCG/ACT AERS    Sig: Inhale 2 puffs into the lungs daily.    Dispense:  4 g    Refill:  0    Order Specific Question:   Lot Number?    Answer:   12/26/2020 B    Order Specific Question:   NDC    Answer06-15-1998    Order Specific Question:   Quantity    Answer:   2    Follow Up:    Return in about 6 weeks (around 10/07/2020),  or if symptoms worsen or fail to improve, for Follow up with Dr. Sherene Sires, 30 MINUTE SLOT. NEXT 4-6 weeks with Dr. Sherene Sires  Notification of test results are managed in the following manner: If there are  any recommendations or changes to the  plan of care discussed in office today,  we will contact you and let you know what they are. If you do not hear from [place], then your results are normal and you can view them through your  MyChart account , or a letter will be sent to you. Thank you again for trusting [place] with your care  - Thank you, [facility]    It is flu season:   >>> Best ways to protect herself from the flu: Receive the yearly flu vaccine, practice good hand hygiene washing with soap and also using hand sanitizer when available, eat a nutritious meals, get adequate rest, hydrate appropriately       Please contact the office if your symptoms worsen or you have concerns that you are not improving.    Thank you for choosing [facility] for your healthcare, and for allowing [place] to partner with you on your healthcare journey. I am thankful to be able to provide care to you today.   Elisha Headland FNP-C

## 2020-08-26 NOTE — Progress Notes (Addendum)
@Patient  ID: , male    DOB: [birth year], 54 y.o.   MRN: [id number]  Chief Complaint  Patient presents with  . Follow-up    dry cough x 2 months     Referring provider: No ref. provider found  HPI:  54 year old male never smoker found our office for chronic cough and history of COVID-19 -October/2021.  Did not require hospitalization.  Did not receive monoclonal antibody infusion.  PMH: History of COVID-19 (October/2021) Smoker/ Smoking History: Never smoker Maintenance: Stiolto Respimat 2.5 Pt of: Dr. [birth year]1970  08/26/2020  - Visit   54 year old never smoker followed in our office for cough variant asthma and chronic cough.  He was last seen in our office in [place] by Dr. Derby in January/2021.  This was via a telephone visit.  He was encouraged to continue follow-up with ENT as well as to continue Qvar and as needed albuterol.  Since last being seen patient has unfortunately contracted COVID-19.  This was in October/2021.  He was seen in urgent care and treated with azithromycin as well as prednisone in the outpatient setting.  Chest x-ray from 07/31/2020 shows patchy airspace opacity consistent with COVID-19 pneumonia.  Patient reporting today that he has had an ongoing chronic cough for the last 2 years.  He feels its been worse over the last week.  He has a dry cough.  He does not have acute worsened shortness of breath status post COVID-19.  The only thing that he feels like has actually helped with the cough is potentially Spiriva Respimat 2.5.  He does not have formal pulmonary function testing on file.  He did not feel that the Qvar was helpful.  See cough ROS and assessment listed below:  08/26/20 - Cough ROS:   When to the symptoms start: 2 years ago  How are you today: worse over last week   Have you had fever/sore throat (first 5 to 7 days of URI) or Have you had cough/nasal congestion (10 to 14 days of URI) : Have you used anything to treat the  cough, as anything improved : spiriva, laying down helps, doesn't cough at night Is it a dry or wet cough: dry cough  Does the cough happen when your breathing or when you breathe out: Other any triggers to your cough, or any aggravating factors:  Daily antihistamine: zyrtec  GERD treatment: omeprazole Singulair: was on it, didn't help   Cough checklist (bolded indicates presence):  Adherence, silent acid reflux, ACE inhibitor, active/history sinus disease, active smoking, adverse effects of medications (amiodarone/Macrodantin/bb), alpha 1, allergies, hx of covid19, aspiration, anxiety, bronchiectasis, congestive heart failure (diastolic)   Questionaires / Pulmonary Flowsheets:   ACT:  No flowsheet data found.  MMRC: No flowsheet data found.  Epworth:  No flowsheet data found.  Tests:   FENO:  No results found for: NITRICOXIDE  PFT: No flowsheet data found.  WALK:  No flowsheet data found.  Imaging: DG Chest 2 View  Result Date: 07/31/2020 CLINICAL DATA:  Cough and shortness of breath COVID 19 positivity EXAM: CHEST - 2 VIEW COMPARISON:  07/22/2020 FINDINGS: Cardiac shadow is within normal limits. Significant increase in patchy airspace opacities noted bilaterally consistent with the given clinical history. No sizable effusion is noted. No acute bony abnormality is seen. IMPRESSION: Significant increase in patchy airspace opacity consistent with the given clinical history of COVID-19 positivity. Electronically Signed   By: 07/24/2020 M.D.   On: 07/31/2020 15:08    Lab Results:  CBC    Component Value Date/Time   WBC 3.6 (L) 07/21/2020 2032   RBC 5.66 07/21/2020 2032   HGB 16.8 07/21/2020 2032   HCT 49.5 07/21/2020 2032   PLT 97 (L) 07/21/2020 2032   MCV 87.5 07/21/2020 2032   MCH 29.7 07/21/2020 2032   MCHC 33.9 07/21/2020 2032   RDW 13.2 07/21/2020 2032   LYMPHSABS 2.0 06/27/2019 1003   MONOABS 0.6 06/27/2019 1003   EOSABS 0.1 06/27/2019 1003   BASOSABS  0.0 06/27/2019 1003    BMET    Component Value Date/Time   NA 134 (L) 07/21/2020 2032   K 3.5 07/21/2020 2032   CL 99 07/21/2020 2032   CO2 24 07/21/2020 2032   GLUCOSE 115 (H) 07/21/2020 2032   BUN 17 07/21/2020 2032   CREATININE 0.97 07/21/2020 2032   CALCIUM 7.9 (L) 07/21/2020 2032   GFRNONAA >60 07/21/2020 2032   GFRAA >60 01/29/2017 1707    BNP No results found for: BNP  ProBNP No results found for: PROBNP  Specialty Problems      Pulmonary Problems   Cough variant asthma      No Known Allergies   There is no immunization history on file for this patient.  Past Medical History:  Diagnosis Date  . Kidney stones   . Sinusitis, chronic     Tobacco History: Social History   Tobacco Use  Smoking Status Never Smoker  Smokeless Tobacco Never Used   Counseling given: Yes   Continue to not smoke  Outpatient Encounter Medications as of 08/26/2020  Medication Sig  . albuterol (VENTOLIN HFA) 108 (90 Base) MCG/ACT inhaler Inhale 2 puffs into the lungs every 4 (four) hours as needed for wheezing or shortness of breath.  . Azelastine HCl 137 MCG/SPRAY SOLN Place 2 sprays into both nostrils 2 (two) times daily.  Marland Kitchen azithromycin (ZITHROMAX) 250 MG tablet Take 1 tablet (250 mg total) by mouth daily. Take first 2 tablets together, then 1 every day until finished.  Marland Kitchen EPINEPHrine 0.3 mg/0.3 mL IJ SOAJ injection See admin instructions.  Marland Kitchen ibuprofen (ADVIL) 200 MG tablet Take by mouth.  Marland Kitchen omeprazole (PRILOSEC) 40 MG capsule TAKE ONE CAPSULE BY MOUTH ONCE DAILY, 30 MINUTES PRIOR TO EVENING MEAL.  Marland Kitchen beclomethasone (QVAR REDIHALER) 80 MCG/ACT inhaler Inhale 2 puffs into the lungs 2 (two) times daily. (Patient not taking: Reported on 08/26/2020)  . benzonatate (TESSALON) 200 MG capsule Take 1 capsule (200 mg total) by mouth 3 (three) times daily as needed for cough.  . chlorpheniramine-HYDROcodone (TUSSIONEX PENNKINETIC ER) 10-8 MG/5ML SUER Take 5 mLs by mouth every 12  (twelve) hours as needed for cough. (Patient not taking: Reported on 08/26/2020)  . Tiotropium Bromide Monohydrate (SPIRIVA RESPIMAT) 2.5 MCG/ACT AERS Inhale 2 puffs into the lungs daily.  . [DISCONTINUED] predniSONE (STERAPRED UNI-PAK 21 TAB) 10 MG (21) TBPK tablet Take by mouth daily. Take 6 tabs by mouth daily  for 2 days, then 5 tabs for 2 days, then 4 tabs for 2 days, then 3 tabs for 2 days, 2 tabs for 2 days, then 1 tab by mouth daily for 2 days   No facility-administered encounter medications on file as of 08/26/2020.     Review of Systems  Review of Systems  Constitutional: Negative for activity change, chills, fatigue, fever and unexpected weight change.  HENT: Negative for postnasal drip, rhinorrhea, sinus pressure, sinus pain and sore throat.   Eyes: Negative.   Respiratory: Positive for cough. Negative for shortness of  breath and wheezing.   Cardiovascular: Negative for chest pain and palpitations.  Gastrointestinal: Negative for constipation, diarrhea, nausea and vomiting.  Endocrine: Negative.   Genitourinary: Negative.   Musculoskeletal: Negative.   Skin: Negative.   Neurological: Negative for dizziness and headaches.  Psychiatric/Behavioral: Negative.  Negative for dysphoric mood. The patient is not nervous/anxious.   All other systems reviewed and are negative.    Physical Exam  BP 122/78 (BP Location: Left Arm, Cuff Size: Normal)   Pulse (!) 114   Temp 97.7 F (36.5 C)   Ht 6' (1.829 m)   Wt 229 lb 6.4 oz (104.1 kg)   SpO2 97%   BMI 31.11 kg/m   Wt Readings from Last 5 Encounters:  08/26/20 229 lb 6.4 oz (104.1 kg)  07/31/20 204 lb (92.5 kg)  07/21/20 222 lb (100.7 kg)  07/26/19 240 lb 6.4 oz (109 kg)  06/27/19 238 lb 9.6 oz (108.2 kg)    BMI Readings from Last 5 Encounters:  08/26/20 31.11 kg/m  07/31/20 27.67 kg/m  07/21/20 30.11 kg/m  07/26/19 32.60 kg/m  06/27/19 32.36 kg/m     Physical Exam Vitals and nursing note reviewed.   Constitutional:      General: He is not in acute distress.    Appearance: Normal appearance. He is normal weight.  HENT:     Head: Normocephalic and atraumatic.     Right Ear: Hearing and external ear normal.     Left Ear: Hearing and external ear normal.     Nose: Nose normal. No mucosal edema or rhinorrhea.     Right Turbinates: Not enlarged.     Left Turbinates: Not enlarged.     Mouth/Throat:     Mouth: Mucous membranes are dry.     Pharynx: Oropharynx is clear. No oropharyngeal exudate.  Eyes:     Pupils: Pupils are equal, round, and reactive to light.  Cardiovascular:     Rate and Rhythm: Normal rate and regular rhythm.     Pulses: Normal pulses.     Heart sounds: Normal heart sounds. No murmur heard.   Pulmonary:     Effort: Pulmonary effort is normal.     Breath sounds: Normal breath sounds. No decreased breath sounds, wheezing or rales.  Musculoskeletal:     Cervical back: Normal range of motion.     Right lower leg: No edema.     Left lower leg: No edema.  Lymphadenopathy:     Cervical: No cervical adenopathy.  Skin:    General: Skin is warm and dry.     Capillary Refill: Capillary refill takes less than 2 seconds.     Findings: No erythema or rash.  Neurological:     General: No focal deficit present.     Mental Status: He is alert and oriented to person, place, and time.     Motor: No weakness.     Coordination: Coordination normal.     Gait: Gait is intact. Gait normal.  Psychiatric:        Mood and Affect: Mood normal.        Behavior: Behavior normal. Behavior is cooperative.        Thought Content: Thought content normal.        Judgment: Judgment normal.       Assessment & Plan:   Cough variant asthma Plan: Resume Spiriva Respimat 2.5 Chest x-ray today Repeat CBC with diff and Ige, stable in 2020, but hx of nasal polyps with surgical removal Tessalon  Perles prescribed today Obtain pulmonary function testing We will have patient establish  with Dr. Sherene Sires in a 30-minute time slot    History of COVID-19 October/2021-tested positive for SARS-CoV-2 Did not require hospitalization Patient was unvaccinated COVID-19 Patient did not receive monoclonal antibody infusion  Plan:  Chest Xray today  PFTs ordered  Obtain COVID-19 vaccinations    Return in about 6 weeks (around 10/07/2020), or if symptoms worsen or fail to improve, for Follow up with Dr. Sherene Sires, 30 MINUTE SLOT.   Coral Ceo, NP 08/26/2020   This appointment required 31 minutes of patient care (this includes precharting, chart review, review of results, face-to-face care, etc.).

## 2020-09-01 ENCOUNTER — Ambulatory Visit (INDEPENDENT_AMBULATORY_CARE_PROVIDER_SITE_OTHER): Payer: BC Managed Care – PPO

## 2020-09-01 ENCOUNTER — Other Ambulatory Visit (INDEPENDENT_AMBULATORY_CARE_PROVIDER_SITE_OTHER): Payer: BC Managed Care – PPO

## 2020-09-01 DIAGNOSIS — R059 Cough, unspecified: Secondary | ICD-10-CM | POA: Diagnosis not present

## 2020-09-01 DIAGNOSIS — Z8616 Personal history of COVID-19: Secondary | ICD-10-CM

## 2020-09-01 DIAGNOSIS — J90 Pleural effusion, not elsewhere classified: Secondary | ICD-10-CM | POA: Diagnosis not present

## 2020-09-01 DIAGNOSIS — J45991 Cough variant asthma: Secondary | ICD-10-CM

## 2020-09-01 LAB — CBC WITH DIFFERENTIAL/PLATELET
Basophils Absolute: 0.1 10*3/uL (ref 0.0–0.1)
Basophils Relative: 1 % (ref 0.0–3.0)
Eosinophils Absolute: 0.1 10*3/uL (ref 0.0–0.7)
Eosinophils Relative: 2.1 % (ref 0.0–5.0)
HCT: 47.3 % (ref 39.0–52.0)
Hemoglobin: 15.7 g/dL (ref 13.0–17.0)
Lymphocytes Relative: 42 % (ref 12.0–46.0)
Lymphs Abs: 2.2 10*3/uL (ref 0.7–4.0)
MCHC: 33.1 g/dL (ref 30.0–36.0)
MCV: 89.5 fl (ref 78.0–100.0)
Monocytes Absolute: 0.7 10*3/uL (ref 0.1–1.0)
Monocytes Relative: 13.7 % — ABNORMAL HIGH (ref 3.0–12.0)
Neutro Abs: 2.2 10*3/uL (ref 1.4–7.7)
Neutrophils Relative %: 41.2 % — ABNORMAL LOW (ref 43.0–77.0)
Platelets: 203 10*3/uL (ref 150.0–400.0)
RBC: 5.29 Mil/uL (ref 4.22–5.81)
RDW: 15.2 % (ref 11.5–15.5)
WBC: 5.3 10*3/uL (ref 4.0–10.5)

## 2020-09-02 LAB — IGE: IgE (Immunoglobulin E), Serum: 10 kU/L (ref <=114)

## 2020-09-03 ENCOUNTER — Other Ambulatory Visit: Payer: Self-pay

## 2020-09-03 MED ORDER — SPIRIVA RESPIMAT 2.5 MCG/ACT IN AERS
2.0000 | INHALATION_SPRAY | Freq: Every day | RESPIRATORY_TRACT | 3 refills | Status: DC
Start: 2020-09-03 — End: 2020-10-03

## 2020-09-07 ENCOUNTER — Other Ambulatory Visit: Payer: Self-pay | Admitting: Pulmonary Disease

## 2020-09-07 DIAGNOSIS — R0602 Shortness of breath: Secondary | ICD-10-CM

## 2020-10-03 ENCOUNTER — Other Ambulatory Visit: Payer: Self-pay

## 2020-10-03 ENCOUNTER — Encounter: Payer: Self-pay | Admitting: Internal Medicine

## 2020-10-03 ENCOUNTER — Ambulatory Visit: Payer: BC Managed Care – PPO | Admitting: Internal Medicine

## 2020-10-03 DIAGNOSIS — Z8616 Personal history of COVID-19: Secondary | ICD-10-CM | POA: Diagnosis not present

## 2020-10-03 DIAGNOSIS — J45991 Cough variant asthma: Secondary | ICD-10-CM

## 2020-10-03 MED ORDER — PREDNISONE 10 MG PO TABS: ORAL_TABLET | ORAL | 0 refills | Status: DC

## 2020-10-03 MED ORDER — ACETAMINOPHEN-CODEINE #4 300-60 MG PO TABS: 1.0000 | ORAL_TABLET | ORAL | 0 refills | Status: DC | PRN

## 2020-10-03 MED ORDER — FAMOTIDINE 20 MG PO TABS: ORAL_TABLET | ORAL | 11 refills | Status: DC

## 2020-10-03 NOTE — Progress Notes (Signed)
@Patient  ID: Jackson Matthews, male    DOB: [birth year]    MRN: [id number]    HPI:  55 year old male never smoker c chronic cough and history of COVID-19 -October/2021.  Did not require hospitalization.  Did not receive monoclonal antibody infusion but rx Prednisone x 4 days cough gone then recurred.        08/26/2020  - Visit with NP   55 year old never smoker followed in our office for cough variant asthma and chronic cough.  He was last seen in our office in [place] by Dr. Derby in January/2021.  This was via a telephone visit.  He was encouraged to continue follow-up with ENT as well as to continue Qvar and as needed albuterol.  Since last being seen patient has unfortunately contracted COVID-19.  This was in October/2021.  He was seen in urgent care and treated with azithromycin as well as prednisone in the outpatient setting.  Chest x-ray from 07/31/2020 shows patchy airspace opacity consistent with COVID-19 pneumonia. Imp/Cough variant asthma Plan: Resume Spiriva Respimat 2.5 Repeat CBC with diff Eos 0.1 nd Ige 10    Tessalon Perles   Obtain pulmonary function testing > not done        10/03/2020  1st ov/Joanthony Hamza New/ consultation re cough x 2016  No chief complaint on file. Dyspnea:  No doe including steps  Cough: 30 min p stir around  Sleeping: zyrtec at bedtime and sleep fine @ 10 degrees and coiugh  does not wake him up    SABA use: albuterol 02: no desats  Constant urge to clear throat daytime despite ent eval "nl" 08/2020    No obvious day to day or daytime variability or assoc excess/ purulent sputum or mucus plugs or hemoptysis or cp or chest tightness, subjective wheeze or overt sinus or hb symptoms.   Sleeping  without nocturnal  or early am exacerbation  of respiratory  c/o's or need for noct saba. Also denies any obvious fluctuation of symptoms with weather or environmental changes or other aggravating or alleviating factors except as outlined above   No  unusual exposure hx or h/o childhood pna/ asthma or knowledge of premature birth.  Current Allergies, Complete Past Medical History, Past Surgical History, Family History, and Social History were reviewed in 09/2020 record.  ROS  The following are not active complaints unless bolded Hoarseness, sore throat, dysphagia, dental problems, itching, sneezing,  nasal congestion or discharge of excess mucus or purulent secretions, ear ache,   fever, chills, sweats, unintended wt loss or wt gain, classically pleuritic or exertional cp,  orthopnea pnd or arm/hand swelling  or leg swelling, presyncope, palpitations, abdominal pain, anorexia, nausea, vomiting, diarrhea  or change in bowel habits or change in bladder habits, change in stools or change in urine, dysuria, hematuria,  rash, arthralgias, visual complaints, headache, numbness, weakness or ataxia or problems with walking or coordination,  change in mood or  memory.        Current Meds  Medication Sig  . acetaminophen-codeine (TYLENOL #4) 300-60 MG tablet Take 1 tablet by mouth every 4 (four) hours as needed for moderate pain.  . Azelastine HCl 137 MCG/SPRAY SOLN Place 2 sprays into both nostrils 2 (two) times daily.  [employer] EPINEPHrine 0.3 mg/0.3 mL IJ SOAJ injection See admin instructions.  . famotidine (PEPCID) 20 MG tablet One after supper  . omeprazole (PRILOSEC) 40 MG capsule TAKE ONE CAPSULE BY MOUTH ONCE DAILY, 30 MINUTES PRIOR TO EVENING MEAL.  Marland Kitchen  predniSONE (DELTASONE) 10 MG tablet Take  4 each am x 2 days,   2 each am x 2 days,  1 each am x 2 days and stop  . [DISCONTINUED] albuterol (VENTOLIN HFA) 108 (90 Base) MCG/ACT inhaler INHALE 2 PUFFS INTO THE LUNGS EVERY 6 (SIX) HOURS AS NEEDED FOR WHEEZING (COUGH).  . [DISCONTINUED] benzonatate (TESSALON) 200 MG capsule Take 1 capsule (200 mg total) by mouth 3 (three) times daily as needed for cough.  . [DISCONTINUED] chlorpheniramine-HYDROcodone (TUSSIONEX PENNKINETIC ER)  10-8 MG/5ML SUER Take 5 mLs by mouth every 12 (twelve) hours as needed for cough.  . [DISCONTINUED] Tiotropium Bromide Monohydrate (SPIRIVA RESPIMAT) 2.5 MCG/ACT AERS Inhale 2 puffs into the lungs daily.       Physical Examination   Wt Readings from Last 3 Encounters:  10/03/20 232 lb (105.2 kg)  08/26/20 229 lb 6.4 oz (104.1 kg)  07/31/20 204 lb (92.5 kg)      Vital signs reviewed  10/03/2020  - Note at rest 02 sats  99% on RA   General appearance:    Pleasant amb wm nad freq dry sounding throat clearing   HEENT : pt wearing mask not removed for exam due to covid -19 concerns.    NECK :  without JVD/Nodes/TM/ nl carotid upstrokes bilaterally   LUNGS: no acc muscle use,  Nl contour chest which is clear to A and P bilaterally without cough on insp or exp maneuvers   CV:  RRR  no s3 or murmur or increase in P2, and no edema   ABD:  soft and nontender with nl inspiratory excursion in the supine position. No bruits or organomegaly appreciated, bowel sounds nl  MS:  Nl gait/ ext warm without deformities, calf tenderness, cyanosis or clubbing No obvious joint restrictions   SKIN: warm and dry without lesions    NEURO:  alert, approp, nl sensorium with  no motor or cerebellar deficits apparent.      I personally reviewed images and agree with radiology impression as follows:  CXR:   PA and lateral 09/02/2019 Improved lung aeration with residual patchy interstitial changes that may reflect scarring.

## 2020-10-03 NOTE — Patient Instructions (Signed)
Stop spiriva  The key to effective treatment for your cough is eliminating the non-stop cycle of cough you're stuck in long enough to let your airway heal completely and then see if there is anything still making you cough once you stop the cough suppression, but this should take no more than 5 days to figure out  Take delsym two tsp every 12 hours and supplement if needed with  Tylenol #4  up to 1-2 every 4 hours to suppress the urge to cough. Swallowing water and/or using ice chips/non mint and menthol containing candies (such as lifesavers or sugarless jolly ranchers) are also effective.  You should rest your voice and avoid activities that you know make you cough.  Once you have eliminated the cough for 3 straight days try reducing the Tylenol #4 first,  then the delsym as tolerated.     Once you have eliminated the cough for 3 straight days try reducing the tramadol first,  then the delsym as tolerated.    Prednisone 10 mg take  4 each am x 2 days,   2 each am x 2 days,  1 each am x 2 days and stop (this is to eliminate allergies and inflammation from coughing)  Protonix (pantoprazole) Take 30-60 min before first meal of the day and Pepcid 20 mg one after supper until return     GERD (REFLUX)  is an extremely common cause of respiratory symptoms, many times with no significant heartburn at all.    It can be treated with medication, but also with lifestyle changes including avoidance of late meals, excessive alcohol, smoking cessation, and avoid fatty foods, chocolate, peppermint, colas, red wine, and acidic juices such as orange juice.  NO MINT OR MENTHOL PRODUCTS SO NO COUGH DROPS   USE HARD CANDY INSTEAD (jolley ranchers or Stover's or Lifesavers (all available in sugarless versions) NO OIL BASED VITAMINS - use powdered substitutes.  Please schedule a follow up office visit in 2 weeks, sooner if needed

## 2020-10-03 NOTE — Assessment & Plan Note (Addendum)
October/2021-tested positive for SARS-CoV-2 Did not require hospitalization Patient was unvaccinated COVID-19 Patient did not receive monoclonal antibody infusion  cxr reviewed and does show some mild scarring but cough does not occur on insp/exp and no sob so no need for inhalers of any kind at this point and repeat cxr next ov if not 100% improved symptomatically          Each maintenance medication was reviewed in detail including emphasizing most importantly the difference between maintenance and prns and under what circumstances the prns are to be triggered using an action plan format where appropriate.  Total time for H and P, extensive chart review, counseling,   and generating customized AVS unique to this new pt office visit / charting  > 60  min

## 2020-10-03 NOTE — Assessment & Plan Note (Signed)
Onset of sinus problems about 2014 "kept getting infections"responded to abx > sinus sugery around 2016  Polyps removed and started shots x about the same time for about a year > cough no better and stopped allergy shots from ENT doctor p a year >  Dec 2021 "all clear" per Willeen Cass [place] -  Allergy profile 09/01/20 >  Eos 0.1 /  IgE 10  -  Cyclical cough rx 10/03/2020    Although tempting to contemplate dx of triad asthma, note the cough did not start until p the polyps were removed which would be an unusual hx and more c/w Upper airway cough syndrome (previously labeled PNDS),  is so named because it's frequently impossible to sort out how much is  CR/sinusitis with freq throat clearing (which can be related to primary GERD)   vs  causing  secondary (" extra esophageal")  GERD from wide swings in gastric pressure that occur with throat clearing, often  promoting self use of mint and menthol lozenges that reduce the lower esophageal sphincter tone and exacerbate the problem further in a cyclical fashion.   These are the same pts (now being labeled as having "irritable larynx syndrome" by some cough centers) who not infrequently have a history of having failed to tolerate ace inhibitors,  dry powder inhalers or biphosphonates or report having atypical/extraesophageal reflux symptoms that don't respond to standard doses of PPI  and are easily confused as having aecopd or asthma flares by even experienced allergists/ pulmonologists (myself included).   Of the three most common causes of  Sub-acute / recurrent or chronic cough, only one (GERD)  can actually contribute to/ trigger  the other two (asthma and post nasal drip syndrome)  and perpetuate the cylce of cough.  While not intuitively obvious, many patients with chronic low grade reflux do not cough until there is a primary insult that disturbs the protective epithelial barrier and exposes sensitive nerve endings.   This is typically viral but can due to  PNDS and  either may apply here.   The point is that once this occurs, it is difficult to eliminate the cycle  using anything but a maximally effective acid suppression regimen at least in the short run, accompanied by an appropriate diet to address non acid GERD and control / eliminate the cough itself for at least 3 days with tyl #3 and also added 6 days of Prednisone in case of component of Th-2 driven upper or lower airways inflammation (if cough responds short term only to relapse befor return while will on rx for uacs that would point to allergic rhinitis/ asthma or eos bronchitis)    The standardized cough guidelines published in Chest by [place] in 2006 are still the best available and consist of a multiple step process (up to 12!) , not a single office visit,  and are intended  to address this problem logically,  with an alogrithm dependent on response to empiric treatment at  each progressive step  to determine a specific diagnosis with  minimal addtional testing needed. Therefore if adherence is an issue or can't be accurately verified,  it's very unlikely the standard evaluation and treatment will be successful here.    Furthermore, response to therapy (other than acute cough suppression, which should only be used short term with avoidance of narcotic containing cough syrups if possible), can be a gradual process for which the patient is not likely to  perceive immediate benefit.  Unlike going to an  eye doctor where the best perscription is almost always the first one and is immediately effective, this is almost never the case in the management of chronic cough syndromes. Therefore the patient needs to commit up front to consistently adhere to recommendations  for up to 6 weeks of therapy directed at the likely underlying problem(s) before the response can be reasonably evaluated.    >>> f/u in 2 weeks to regroup

## 2020-10-17 ENCOUNTER — Encounter: Payer: Self-pay | Admitting: Pulmonary Disease

## 2020-10-17 ENCOUNTER — Ambulatory Visit (INDEPENDENT_AMBULATORY_CARE_PROVIDER_SITE_OTHER): Payer: BC Managed Care – PPO | Admitting: Pulmonary Disease

## 2020-10-17 ENCOUNTER — Other Ambulatory Visit: Payer: Self-pay

## 2020-10-17 ENCOUNTER — Telehealth: Payer: Self-pay | Admitting: Pulmonary Disease

## 2020-10-17 DIAGNOSIS — R059 Cough, unspecified: Secondary | ICD-10-CM | POA: Diagnosis not present

## 2020-10-17 DIAGNOSIS — J45991 Cough variant asthma: Secondary | ICD-10-CM | POA: Diagnosis not present

## 2020-10-17 DIAGNOSIS — Z8616 Personal history of COVID-19: Secondary | ICD-10-CM | POA: Diagnosis not present

## 2020-10-17 MED ORDER — PREDNISONE 10 MG PO TABS: ORAL_TABLET | ORAL | 0 refills | Status: AC

## 2020-10-17 NOTE — Progress Notes (Signed)
Virtual Visit via Telephone Note  I connected with [place] on 10/17/20 at  9:30 AM EST by telephone and verified that I am speaking with the correct person using two identifiers.  Location: Patient: Home Provider: Office [facility] - [address] [place], [address], [place], [place] [address]   I discussed the limitations, risks, security and privacy concerns of performing an evaluation and management service by telephone and the availability of in person appointments. I also discussed with the patient that there may be a patient responsible charge related to this service. The patient expressed understanding and agreed to proceed.  Patient consented to consult via telephone: Yes People present and their role in pt care: Pt     History of Present Illness:  55 year old male never smoker found our office for chronic cough and history of COVID-19 -October/2021.  Did not require hospitalization.  Did not receive monoclonal antibody infusion.  PMH: History of COVID-19 (October/2021) Smoker/ Smoking History: Never smoker Maintenance: None Pt of: Dr. Sherene Sires  Chief complaint: 2 week follow    55 year old male never smoker followed in our office for cough variant asthma and history of COVID-19.  Patient establish care with Dr. Sherene Sires in January/2022.  Patient completing a 2-week follow-up visit.  Complaining televisit today.  Patient reports that when on prednisone his cough went away completely.  As soon as he transitioned off of prednisone the cough quickly returned within 2 weeks.  He continues to report a dry hacking cough.  He has been previously evaluated by ENT Dr. Willeen Cass in [place], [place].  He has not yet been scheduled for pulmonary function testing.  Observations/Objective:  09/01/2020-IgE-10 09/01/2020-CBC with differential- eosinophils relative 2.1, eosinophils absolute 0.1  09/01/2020-chest x-ray-improved aeration with residual patchy interstitial changes that  may reflect scarring   Social History   Tobacco Use  Smoking Status Never Smoker  Smokeless Tobacco Never Used    There is no immunization history on file for this patient.    Assessment and Plan:  Cough variant asthma Prednisone responsive cough Low eosinophils Low IgE Evaluated by ENT Dr. Willeen Cass in [place], [place] No formal pulmonary function testing Never smoker Status post COVID-19 infection in October/2021 History of nasal polyps  Plan: Long prednisone taper today Schedule pulmonary function testing Consider high-resolution CT chest based off of pulmonary function testing results Continue omeprazole Continue Pepcid Continue Astelin nasal spray Follow-up in our office after pulmonary function testing  History of COVID-19 Mild scarring on chest x-ray December/2021  Plan: Schedule pulmonary function testing Could consider high-resolution CT chest imaging after pulmonary function test   Follow Up Instructions:  Return in about 4 weeks (around 11/14/2020), or if symptoms worsen or fail to improve, for Follow up with Dr. Sherene Sires, Follow up for FULL PFT - 60 min.   I discussed the assessment and treatment plan with the patient. The patient was provided an opportunity to ask questions and all were answered. The patient agreed with the plan and demonstrated an understanding of the instructions.   The patient was advised to call back or seek an in-person evaluation if the symptoms worsen or if the condition fails to improve as anticipated.  I provided 24 minutes of non-face-to-face time during this encounter.   Coral Ceo, NP

## 2020-10-17 NOTE — Telephone Encounter (Signed)
Attempted to call pt to get PFT scheduled as well as covid test that will need to be done 2-3 days before OV and also to see if I could get OV scheduled with Dr. Sherene Sires after PFT. Unable to reach pt. Left pt a detailed message to call office so we can get appts scheduled and also made pt aware that he will need to be covid tested 2-3 days prior to the PFT.  Will wait return call from pt.

## 2020-10-17 NOTE — Assessment & Plan Note (Signed)
Prednisone responsive cough Low eosinophils Low IgE Evaluated by ENT Dr. Willeen Cass in [place], [place] No formal pulmonary function testing Never smoker Status post COVID-19 infection in October/2021 History of nasal polyps  Plan: Long prednisone taper today Schedule pulmonary function testing Consider high-resolution CT chest based off of pulmonary function testing results Continue omeprazole Continue Pepcid Continue Astelin nasal spray Follow-up in our office after pulmonary function testing

## 2020-10-17 NOTE — Assessment & Plan Note (Signed)
Mild scarring on chest x-ray December/2021  Plan: Schedule pulmonary function testing Could consider high-resolution CT chest imaging after pulmonary function test

## 2020-10-17 NOTE — Patient Instructions (Addendum)
You were seen today by Coral Ceo, NP  for:   1. Cough variant asthma 2. History of COVID-19 3. Cough  - predniSONE (DELTASONE) 10 MG tablet; Take 2 tablets (20 mg total) by mouth daily with breakfast for 14 days, THEN 1 tablet (10 mg total) daily with breakfast for 14 days.  Dispense: 42 tablet; Refill: 0  We will get you scheduled for pulmonary function testing  Based on pulmonary function test results could consider CT chest imaging  Continue omeprazole  Continue Pepcid   We recommend today:   Meds ordered this encounter  Medications   predniSONE (DELTASONE) 10 MG tablet    Sig: Take 2 tablets (20 mg total) by mouth daily with breakfast for 14 days, THEN 1 tablet (10 mg total) daily with breakfast for 14 days.    Dispense:  42 tablet    Refill:  0    Follow Up:    Return in about 4 weeks (around 11/14/2020), or if symptoms worsen or fail to improve, for Follow up with Dr. Sherene Sires, Follow up for FULL PFT - 60 min.   Notification of test results are managed in the following manner: If there are  any recommendations or changes to the  plan of care discussed in office today,  we will contact you and let you know what they are. If you do not hear from [place], then your results are normal and you can view them through your  MyChart account , or a letter will be sent to you. Thank you again for trusting [place] with your care  - Thank you, [facility]    It is flu season:   >>> Best ways to protect herself from the flu: Receive the yearly flu vaccine, practice good hand hygiene washing with soap and also using hand sanitizer when available, eat a nutritious meals, get adequate rest, hydrate appropriately       Please contact the office if your symptoms worsen or you have concerns that you are not improving.   Thank you for choosing [facility] for your healthcare, and for allowing [place] to partner with you on your healthcare journey. I am thankful to be able to provide  care to you today.   Elisha Headland FNP-C

## 2020-10-30 NOTE — Telephone Encounter (Signed)
Pt called back and was able to get PFT scheduled. Nothing further needed.

## 2020-11-07 ENCOUNTER — Other Ambulatory Visit: Payer: Self-pay | Admitting: Pulmonary Disease

## 2020-11-07 DIAGNOSIS — Z8616 Personal history of COVID-19: Secondary | ICD-10-CM

## 2020-11-07 DIAGNOSIS — R059 Cough, unspecified: Secondary | ICD-10-CM

## 2020-11-07 DIAGNOSIS — J45991 Cough variant asthma: Secondary | ICD-10-CM

## 2020-11-27 ENCOUNTER — Ambulatory Visit: Payer: BC Managed Care – PPO | Admitting: Internal Medicine

## 2020-12-03 DIAGNOSIS — A499 Bacterial infection, unspecified: Secondary | ICD-10-CM | POA: Diagnosis not present

## 2020-12-03 DIAGNOSIS — J301 Allergic rhinitis due to pollen: Secondary | ICD-10-CM | POA: Diagnosis not present

## 2020-12-23 ENCOUNTER — Ambulatory Visit: Payer: BC Managed Care – PPO | Admitting: Internal Medicine

## 2020-12-25 ENCOUNTER — Other Ambulatory Visit: Payer: Self-pay | Admitting: Pulmonary Disease

## 2020-12-25 DIAGNOSIS — R0602 Shortness of breath: Secondary | ICD-10-CM

## 2021-01-01 DIAGNOSIS — J0100 Acute maxillary sinusitis, unspecified: Secondary | ICD-10-CM | POA: Diagnosis not present

## 2021-01-12 DIAGNOSIS — J320 Chronic maxillary sinusitis: Secondary | ICD-10-CM | POA: Diagnosis not present

## 2021-02-06 ENCOUNTER — Ambulatory Visit: Payer: BC Managed Care – PPO | Admitting: Internal Medicine

## 2021-03-02 ENCOUNTER — Encounter: Payer: Self-pay | Admitting: Internal Medicine

## 2021-03-02 ENCOUNTER — Telehealth: Payer: Self-pay | Admitting: Internal Medicine

## 2021-03-02 ENCOUNTER — Ambulatory Visit (INDEPENDENT_AMBULATORY_CARE_PROVIDER_SITE_OTHER): Payer: BC Managed Care – PPO | Admitting: Internal Medicine

## 2021-03-02 DIAGNOSIS — J45991 Cough variant asthma: Secondary | ICD-10-CM

## 2021-03-02 MED ORDER — MONTELUKAST SODIUM 10 MG PO TABS: 10.0000 mg | ORAL_TABLET | Freq: Every day | ORAL | 11 refills | Status: DC

## 2021-03-02 MED ORDER — PREDNISONE 10 MG PO TABS: ORAL_TABLET | ORAL | 0 refills | Status: DC

## 2021-03-02 NOTE — Telephone Encounter (Signed)
Called and spoke with pt who is requesting his OV today with MW today 6/6 at 11:30 to be changed to a televisit as pt is currently not feeling well and would prefer to have a televisit if okayed.  Dr. Sherene Sires, please advise if you are okay with this.

## 2021-03-02 NOTE — Patient Instructions (Signed)
Prednisone 10 mg Take 4 for three days 3 for three days 2 for three days 1 for three days and stop   Singulair 10 mg each pm   For cough > delsym 2 tsp every 12 hours as needed   GERD (REFLUX)  is an extremely common cause of respiratory symptoms just like yours , many times with no obvious heartburn at all.    It can be treated with medication, but also with lifestyle changes including elevation of the head of your bed (ideally with 6 -8inch blocks under the headboard of your bed),  Smoking cessation, avoidance of late meals, excessive alcohol, and avoid fatty foods, chocolate, peppermint, colas, red wine, and acidic juices such as orange juice.  NO MINT OR MENTHOL PRODUCTS SO NO COUGH DROPS  USE SUGARLESS CANDY INSTEAD (Jolley ranchers or Stover's or Life Savers) or even ice chips will also do - the key is to swallow to prevent all throat clearing. NO OIL BASED VITAMINS - use powdered substitutes.  Avoid fish oil when coughing.  Follow up one month in [place] office with all active medications including over the counter meds

## 2021-03-02 NOTE — Telephone Encounter (Signed)
Fine to set it up that way

## 2021-03-02 NOTE — Assessment & Plan Note (Signed)
Onset of sinus problems about 2014 "kept getting infections"responded to abx > sinus sugery around 2016  Polyps removed and started shots x about the same time for about a year > cough no better and stopped allergy shots from ENT doctor p a year >  Dec 2021 "all clear" per Willeen Cass [place] -  Allergy profile 09/01/20 >  Eos 0.1 /  IgE 10  -  Cyclical cough rx 10/03/2020  ? 70&% better until warmer weather then worse - 03/02/2021 start sigulair with 12 days pred then re-eval in 4 weeks with all meds in hand   Absence of cough noct or active sinus complaints against asthma as is lack of resp to qvar and spiriva but reported pred response albeit subtotal more compelling for eos bronchtis/ asthma.   >>> singulair plus pred x 12 day taper then ov with all meds in hand using a trust but verify approach to confirm accurate Medication  Reconciliation The principal here is that until we are certain that the  patients are doing what we've asked, it makes no sense to ask them to do more.   Each maintenance medication was reviewed in detail including most importantly the difference between maintenance and as needed and under what circumstances the prns are to be used.  Please see AVS for specific  Instructions which are unique to this visit and I personally typed out  which were reviewed in detail over the phone with the patient and a copy provided via my chart

## 2021-03-02 NOTE — Telephone Encounter (Signed)
pls call pt at [phone]

## 2021-03-02 NOTE — Telephone Encounter (Signed)
Called and spoke with pt letting him know that Dr. Sherene Sires said he was fine with his appt to be a televisit and he verbalized understanding. appt has been changed and have placed preferred number by pt for MW to call. Nothing further needed.

## 2021-03-02 NOTE — Progress Notes (Signed)
@Patient  ID: [place], male    DOB: Dec 31, 1965    MRN: [id number]    HPI:  89 yowm  never smoker c chronic cough and history of COVID-19 -October/2021.  Did not require hospitalization.  Did not receive monoclonal antibody infusion but rx Prednisone x 4 days cough gone then recurred.        08/26/2020  - Visit with NP  55 year old never smoker followed in our office for cough variant asthma and chronic cough.  He was last seen in our office in [place] by Dr. Derby in January/2021.  This was via a telephone visit.  He was encouraged to continue follow-up with ENT as well as to continue Qvar and as needed albuterol.  Since last being seen patient has unfortunately contracted COVID-19.  This was in October/2021.  He was seen in urgent care and treated with azithromycin as well as prednisone in the outpatient setting.  Chest x-ray from 07/31/2020 shows patchy airspace opacity consistent with COVID-19 pneumonia. Imp/Cough variant asthma Plan: Resume Spiriva Respimat 2.5 Repeat CBC with diff Eos 0.1 nd Ige 10    Tessalon Perles   Obtain pulmonary function testing > not done        10/03/2020  1st ov/[place] consultation re cough x 2016  No chief complaint on file. Dyspnea:  No doe including steps  Cough: 30 min p stir around  Sleeping: zyrtec at bedtime and sleep fine @ 10 degrees and coiugh  does not wake him up    SABA use: albuterol 02: no desats  Constant urge to clear throat daytime despite ent eval "nl" 08/2020  rec Stop spiriva The key to effective treatment for your cough is eliminating the non-stop cycle of cough Take delsym two tsp every 12 hours and supplement if needed with  Tylenol #4  up to 1-2 every 4 hours to suppress the urge to cough. Swallowing water and/or using ice chips/non mint and menthol containing candies (such as lifesavers or sugarless jolly ranchers) are also effective.  You should rest your voice and avoid activities that you know make you  cough. Once you have eliminated the cough for 3 straight days try reducing the Tylenol #4 first,  then the delsym as tolerated.    Once you have eliminated the cough for 3 straight days try reducing the tramadol first,  then the delsym as tolerated.   Prednisone 10 mg take  4 each am x 2 days,   2 each am x 2 days,  1 each am x 2 days and stop (this is to eliminate allergies and inflammation from coughing) Protonix (pantoprazole) Take 30-60 min before first meal of the day and Pepcid 20 mg one after supper until return    GERD diet  Please schedule a follow up office visit in 2 weeks, sooner if needed      Virtual Visit via Telephone Note 03/02/2021   I connected with 05/02/2021 [place] on 03/02/21 at 11 AM by telephone and verified that I am speaking with the correct person using two identifiers. Pt is at home and this call made from my office with no other participants    I discussed the limitations, risks, security and privacy concerns of performing an evaluation and management service by telephone and the availability of in person appointments. I also discussed with the patient that there may be a patient responsible charge related to this service. The patient expressed understanding and agreed to proceed.   History of  Present Illness:   70% improvement in cough p last ov /  Much better supine/ sleeping / worse with activity esp with warmer weather or going from cold air into warm  Dyspnea:  No sob, just cough Cough: dry hack/ no obvious nasal symptoms  Sleeping:  At 30 degrees sleep number  SABA use: none, qvar didn't work, spiriva didn't work      No obvious patterns in  day to day or daytime variability or assoc excess/ purulent sputum or mucus plugs or hemoptysis or cp or chest tightness, subjective wheeze or overt sinus or hb symptoms.    Also denies any obvious fluctuation of symptoms with weather or environmental changes or other aggravating or alleviating factors except as  outlined above.   Meds reviewed/ med reconciliation completed     No outpatient medications have been marked as taking for the 03/02/21 encounter (Office Visit) with [place], MD.         Observations/Objective: occ throat clearing, no conversational sob, good voice texture   Assessment and Plan: See problem list for active a/p's   Follow Up Instructions: See avs for instructions unique to this ov which includes revised/ updated med list     I discussed the assessment and treatment plan with the patient. The patient was provided an opportunity to ask questions and all were answered. The patient agreed with the plan and demonstrated an understanding of the instructions.   The patient was advised to call back or seek an in-person evaluation if the symptoms worsen or if the condition fails to improve as anticipated.  I provided 25  minutes of non-face-to-face time during this encounter.   [place] Hughs, MD

## 2021-04-06 ENCOUNTER — Ambulatory Visit (INDEPENDENT_AMBULATORY_CARE_PROVIDER_SITE_OTHER): Payer: BC Managed Care – PPO | Admitting: Internal Medicine

## 2021-04-06 ENCOUNTER — Other Ambulatory Visit: Payer: Self-pay

## 2021-04-06 ENCOUNTER — Encounter: Payer: Self-pay | Admitting: Internal Medicine

## 2021-04-06 DIAGNOSIS — J45991 Cough variant asthma: Secondary | ICD-10-CM | POA: Diagnosis not present

## 2021-04-06 MED ORDER — BUDESONIDE-FORMOTEROL FUMARATE 80-4.5 MCG/ACT IN AERO: INHALATION_SPRAY | RESPIRATORY_TRACT | 12 refills | Status: DC

## 2021-04-06 MED ORDER — PREDNISONE 10 MG PO TABS: ORAL_TABLET | ORAL | 0 refills | Status: DC

## 2021-04-06 NOTE — Patient Instructions (Addendum)
Start Symbicort 80 Take 2 puffs first thing in am and then another 2 puffs about 12 hours later.   Work on inhaler technique:  relax and gently blow all the way out then take a nice smooth full deep breath back in, triggering the inhaler at same time you start breathing in.  Hold for up to 5 seconds if you can. Blow out thru nose. Rinse and gargle with water when done.  If mouth or throat bother you at all,  try brushing teeth/gums/tongue with arm and hammer toothpaste/ make a slurry and gargle and spit out.   Resume Try prilosec (omeprazole) 40mg   Take 30-60 min before first meal of the day and Pepcid ac (famotidine) 20 mg one after supper  until  return  GERD (REFLUX)  is an extremely common cause of respiratory symptoms just like yours , many times with no obvious heartburn at all.    It can be treated with medication, but also with lifestyle changes including elevation of the head of your bed (ideally with 6 -8inch blocks under the headboard of your bed),  Smoking cessation, avoidance of late meals, excessive alcohol, and avoid fatty foods, chocolate, peppermint, colas, red wine, and acidic juices such as orange juice.  NO MINT OR MENTHOL PRODUCTS SO NO COUGH DROPS  USE SUGARLESS CANDY INSTEAD (Jolley ranchers or Stover's or Life Savers) or even ice chips will also do - the key is to swallow to prevent all throat clearing. NO OIL BASED VITAMINS - use powdered substitutes.  Avoid fish oil when coughing.   Please schedule a follow up office visit in 4 weeks, sooner if needed  with all medications /inhalers/ solutions in hand so we can verify exactly what you are taking. This includes all medications from all doctors and over the counters

## 2021-04-06 NOTE — Progress Notes (Signed)
@Patient  ID: Jackson Matthews, male    DOB: 08/18/1966    MRN: [id number]    HPI:  55 year old male never smoker c chronic cough and history of COVID-19 -October/2021.  Did not require hospitalization.  Did not receive monoclonal antibody infusion but rx Prednisone x 4 days cough gone then recurred.        08/26/2020  - Visit with NP   55 year old never smoker followed in our office for cough variant asthma and chronic cough.  He was last seen in our office in [place] by Dr. Derby in January/2021.  This was via a telephone visit.  He was encouraged to continue follow-up with ENT as well as to continue Qvar and as needed albuterol.  Since last being seen patient has unfortunately contracted COVID-19.  This was in October/2021.  He was seen in urgent care and treated with azithromycin as well as prednisone in the outpatient setting.  Chest x-ray from 07/31/2020 shows patchy airspace opacity consistent with COVID-19 pneumonia. Imp/Cough variant asthma Plan: Resume Spiriva Respimat 2.5 Repeat CBC with diff Eos 0.1 nd Ige 10    Tessalon Perles   Obtain pulmonary function testing > not done      10/03/2020  1st ov/Jackson Matthews [facility]/ consultation re cough x 2016  Dyspnea:  No doe including steps  Cough: 30 min p stir around  Sleeping: zyrtec at bedtime and sleep fine @ 10 degrees and coiugh  does not wake him up    SABA use: albuterol 02: no desats  Constant urge to clear throat daytime despite ent eval "nl" 08/2020   Rec Prednisone 10 mg Take 4 for three days 3 for three days 2 for three days 1 for three days and stop  Singulair 10 mg each pm  For cough > delsym 2 tsp every 12 hours as needed  GERD  die  Follow up one month in [place] office with all active medications including over the counter meds   Televisit 03/02/21 Prednisone 10 mg Take 4 for three days 3 for three days 2 for three days 1 for three days and stop  Singulair 10 mg each pm  For cough > delsym 2 tsp every 12 hours as  needed     04/06/2021  f/u ov/[place] office/Jackson Matthews re:  Chief Complaint  Patient presents with   Follow-up    Cough has improved slightly since the last visit. He states cough is non prod and he feels constant sensation that something is in his throat. He rarely uses his albuterol.    Dyspnea:  only sob if coughing  Cough: mostly going outside  Sleeping: doesn't disturb sleep SABA use: none 02: none  Covid status: had likely delta oct 2021 Lung cancer screening: never smoker    No obvious day to day or daytime variability or assoc excess/ purulent sputum or mucus plugs or hemoptysis or cp or chest tightness, subjective wheeze or overt sinus or hb symptoms.   Sleeping  without nocturnal  or early am exacerbation  of respiratory  c/o's or need for noct saba. Also denies any obvious fluctuation of symptoms with weather or environmental changes or other aggravating or alleviating factors except as outlined above   No unusual exposure hx or h/o childhood pna/ asthma or knowledge of premature birth.  Current Allergies, Complete Past Medical History, Past Surgical History, Family History, and Social History were reviewed in Nov 2021 record.  ROS  The following are not active complaints unless bolded Hoarseness,  sore throat, dysphagia, dental problems, itching, sneezing,  nasal congestion or discharge of excess mucus or purulent secretions, ear ache,   fever, chills, sweats, unintended wt loss or wt gain, classically pleuritic or exertional cp,  orthopnea pnd or arm/hand swelling  or leg swelling, presyncope, palpitations, abdominal pain, anorexia, nausea, vomiting, diarrhea  or change in bowel habits or change in bladder habits, change in stools or change in urine, dysuria, hematuria,  rash, arthralgias, visual complaints, headache, numbness, weakness or ataxia or problems with walking or coordination,  change in mood or  memory.        Current Meds  Medication Sig    acetaminophen-codeine (TYLENOL #4) 300-60 MG tablet Take 1 tablet by mouth every 4 (four) hours as needed for moderate pain.   albuterol (VENTOLIN HFA) 108 (90 Base) MCG/ACT inhaler INHALE 2 PUFFS INTO THE LUNGS EVERY 6 HOURS AS NEEDED FOR WHEEZING (COUGH).   Azelastine HCl 137 MCG/SPRAY SOLN Place 2 sprays into both nostrils 2 (two) times daily.   budesonide-formoterol (SYMBICORT) 80-4.5 MCG/ACT inhaler Take 2 puffs first thing in am and then another 2 puffs about 12 hours later.   EPINEPHrine 0.3 mg/0.3 mL IJ SOAJ injection See admin instructions.   famotidine (PEPCID) 20 MG tablet One after supper   montelukast (SINGULAIR) 10 MG tablet Take 1 tablet (10 mg total) by mouth at bedtime.   omeprazole (PRILOSEC) 40 MG capsule TAKE ONE CAPSULE BY MOUTH ONCE DAILY, 30 MINUTES PRIOR TO EVENING MEAL.   predniSONE (DELTASONE) 10 MG tablet Take 4 for three days 3 for three days 2 for three days 1 for three days and stop              Current Meds  Medication Sig   acetaminophen-codeine (TYLENOL #4) 300-60 MG tablet Take 1 tablet by mouth every 4 (four) hours as needed for moderate pain.   Azelastine HCl 137 MCG/SPRAY SOLN Place 2 sprays into both nostrils 2 (two) times daily.   EPINEPHrine 0.3 mg/0.3 mL IJ SOAJ injection See admin instructions.   famotidine (PEPCID) 20 MG tablet One after supper   omeprazole (PRILOSEC) 40 MG capsule TAKE ONE CAPSULE BY MOUTH ONCE DAILY, 30 MINUTES PRIOR TO EVENING MEAL.   predniSONE (DELTASONE) 10 MG tablet Take  4 each am x 2 days,   2 each am x 2 days,  1 each am x 2 days and stop   [DISCONTINUED] albuterol (VENTOLIN HFA) 108 (90 Base) MCG/ACT inhaler INHALE 2 PUFFS INTO THE LUNGS EVERY 6 (SIX) HOURS AS NEEDED FOR WHEEZING (COUGH).   [DISCONTINUED] benzonatate (TESSALON) 200 MG capsule Take 1 capsule (200 mg total) by mouth 3 (three) times daily as needed for cough.   [DISCONTINUED] chlorpheniramine-HYDROcodone (TUSSIONEX PENNKINETIC ER) 10-8 MG/5ML SUER Take 5 mLs  by mouth every 12 (twelve) hours as needed for cough.   [DISCONTINUED] Tiotropium Bromide Monohydrate (SPIRIVA RESPIMAT) 2.5 MCG/ACT AERS Inhale 2 puffs into the lungs daily.       Physical Examination   04/06/2021       236  10/03/20 232 lb (105.2 kg)  08/26/20 229 lb 6.4 oz (104.1 kg)  07/31/20 204 lb (92.5 kg)    Vital signs reviewed  04/06/2021  - Note at rest 02 sats  96% on RA   General appearance:    amb wm, occ dry harsh cough    HEENT : pt wearing mask not removed for exam due to covid -19 concerns.    NECK :  without JVD/Nodes/TM/ nl  carotid upstrokes bilaterally   LUNGS: no acc muscle use,  Nl contour chest which is clear to A and P bilaterally without cough on insp or exp maneuvers   CV:  RRR  no s3 or murmur or increase in P2, and no edema   ABD:  soft and nontender with nl inspiratory excursion in the supine position. No bruits or organomegaly appreciated, bowel sounds nl  MS:  Nl gait/ ext warm without deformities, calf tenderness, cyanosis or clubbing No obvious joint restrictions   SKIN: warm and dry without lesions    NEURO:  alert, approp, nl sensorium with  no motor or cerebellar deficits apparent.

## 2021-04-06 NOTE — Assessment & Plan Note (Signed)
Onset of sinus problems about 2014 "kept getting infections"responded to abx > sinus sugery around 2016  Polyps removed and started shots x about the same time for about a year > cough no better and stopped allergy shots from ENT doctor p a year >  Dec 2021 "all clear" per Willeen Cass [place] - [place] sinus 06/2620 nl  -  Allergy profile 09/01/20 >  Eos 0.1 /  IgE 10  -  Cyclical cough rx 10/03/2020  ? 70&% better until warmer weather then worse - 03/02/2021 start sigulair with 12 days pred then re-eval in 4 weeks with all meds in hand> no better so try prednsione x 12 days and symb 80     cough responds short term only to relapse before return while will on full rx for uacs (as above), then  that would point to allergic rhinitis/ asthma or eos bronchitis as alternative dx)  > try adding symb 80 to singulair and max gerd rx then f/u in 4 weeks  Advised: The standardized cough guidelines published in Chest by [place] in 2006 are still the best available and consist of a multiple step process (up to 12!) , not a single office visit,  and are intended  to address this problem logically,  with an alogrithm dependent on response to empiric treatment at  each progressive step  to determine a specific diagnosis with  minimal addtional testing needed. Therefore if adherence is an issue or can't be accurately verified,  it's very unlikely the standard evaluation and treatment will be successful here.    Furthermore, response to therapy (other than acute cough suppression, which should only be used short term with avoidance of narcotic containing cough syrups if possible), can be a gradual process for which the patient is not likely to  perceive immediate benefit.  Unlike going to an eye doctor where the best perscription is almost always the first one and is immediately effective, this is almost never the case in the management of chronic cough syndromes. Therefore the patient needs to commit up front to consistently  adhere to recommendations  for up to 6 weeks of therapy directed at the likely underlying problem(s) before the response can be reasonably evaluated.   - The proper method of use, as well as anticipated side effects, of a metered-dose inhaler were discussed and demonstrated to the patient using teach back method.     Each maintenance medication was reviewed in detail including emphasizing most importantly the difference between maintenance and prns and under what circumstances the prns are to be triggered using an action plan format where appropriate.  Total time for H and P, chart review, counseling, reviewing hfa  device(s) and generating customized AVS unique to this office visit / same day charting = 26 min

## 2021-04-26 ENCOUNTER — Other Ambulatory Visit: Payer: Self-pay | Admitting: Pulmonary Disease

## 2021-04-26 DIAGNOSIS — R0602 Shortness of breath: Secondary | ICD-10-CM

## 2021-05-06 ENCOUNTER — Ambulatory Visit: Payer: BC Managed Care – PPO | Admitting: Internal Medicine

## 2021-05-06 ENCOUNTER — Encounter: Payer: Self-pay | Admitting: Internal Medicine

## 2021-05-06 ENCOUNTER — Other Ambulatory Visit: Payer: Self-pay

## 2021-05-06 DIAGNOSIS — J45991 Cough variant asthma: Secondary | ICD-10-CM | POA: Diagnosis not present

## 2021-05-06 MED ORDER — PREDNISONE 10 MG PO TABS: ORAL_TABLET | ORAL | 0 refills | Status: DC

## 2021-05-06 MED ORDER — BUDESONIDE-FORMOTEROL FUMARATE 80-4.5 MCG/ACT IN AERO: INHALATION_SPRAY | RESPIRATORY_TRACT | 12 refills | Status: DC

## 2021-05-06 NOTE — Progress Notes (Signed)
@Patient  ID: Jackson Matthews, male    DOB: [birth year]    MRN: [id number]    HPI:  55 year old male never smoker c chronic cough and history of COVID-19 -October/2021.  Did not require hospitalization.  Did not receive monoclonal antibody infusion but rx Prednisone x 4 days cough gone then recurred.        08/26/2020  - Visit with NP   55 year old never smoker followed in our office for cough variant asthma and chronic cough.  He was last seen in our office in [place] by Dr. Derby in January/2021.  This was via a telephone visit.  He was encouraged to continue follow-up with ENT as well as to continue Qvar and as needed albuterol.  Since last being seen patient has unfortunately contracted COVID-19.  This was in October/2021.  He was seen in urgent care and treated with azithromycin as well as prednisone in the outpatient setting.  Chest x-ray from 07/31/2020 shows patchy airspace opacity consistent with COVID-19 pneumonia. Imp/Cough variant asthma Plan: Resume Spiriva Respimat 2.5 Repeat CBC with diff Eos 0.1 nd Ige 10    Tessalon Perles   Obtain pulmonary function testing > not done      10/03/2020  1st ov/Jackson Matthews New/ consultation re cough x 2016  Dyspnea:  No doe including steps  Cough: 30 min p stir around  Sleeping: zyrtec at bedtime and sleep fine @ 10 degrees and coiugh  does not wake him up    SABA use: albuterol 02: no desats  Constant urge to clear throat daytime despite ent eval "nl" 08/2020   Rec Prednisone 10 mg Take 4 for three days 3 for three days 2 for three days 1 for three days and stop  Singulair 10 mg each pm  For cough > delsym 2 tsp every 12 hours as needed  GERD  die  Follow up one month in [place] office with all active medications including over the counter meds   Televisit 03/02/21 Prednisone 10 mg Take 4 for three days 3 for three days 2 for three days 1 for three days and stop  Singulair 10 mg each pm  For cough > delsym 2 tsp every 12 hours as  needed     04/06/2021  f/u ov/[facility] office/Jackson Matthews re:  Chief Complaint  Patient presents with   Follow-up    Cough has improved slightly since the last visit. He states cough is non prod and he feels constant sensation that something is in his throat. He rarely uses his albuterol.    Dyspnea:  only sob if coughing  Cough: mostly going outside  Sleeping: doesn't disturb sleep SABA use: none 02: none  Covid status: had likely delta oct 2021 Lung cancer screening: never smoker  Rec Start Symbicort 80 Take 2 puffs first thing in am and then another 2 puffs about 12 hours later.  Work on inhaler technique:  Resume Try prilosec (omeprazole) 40mg   Take 30-60 min before first meal of the day and Pepcid ac (famotidine) 20 mg one after supper  until  return GERD diet reviewed, bed blocks rec  Please schedule a follow up office visit in 4 weeks, sooner if needed  with all medications /inhalers/ solutions in hand   05/06/2021  f/u ov/[place] office/Jackson Matthews re: cough x 2016  pred responsive and was supposed to maint on symb 80 but got proair instead  No chief complaint on file.  Dyspnea:  Not limited by breathing from desired activities   Cough:  worse when outside - only better on prednisone Sleeping: no resp problems SABA use: no better  02: none  Covid status: never vax / infected      No obvious day to day or daytime variability or assoc excess/ purulent sputum or mucus plugs or hemoptysis or cp or chest tightness, subjective wheeze or overt sinus or hb symptoms.   Sleeping  without nocturnal  or early am exacerbation  of respiratory  c/o's or need for noct saba. Also denies any obvious fluctuation of symptoms with weather or environmental changes or other aggravating or alleviating factors except as outlined above   No unusual exposure hx or h/o childhood pna/ asthma or knowledge of premature birth.  Current Allergies, Complete Past Medical History, Past Surgical History, Family  History, and Social History were reviewed in [employer] record.  ROS  The following are not active complaints unless bolded Hoarseness, sore throat, dysphagia, dental problems, itching, sneezing,  nasal congestion or discharge of excess mucus or purulent secretions, ear ache,   fever, chills, sweats, unintended wt loss or wt gain, classically pleuritic or exertional cp,  orthopnea pnd or arm/hand swelling  or leg swelling, presyncope, palpitations, abdominal pain, anorexia, nausea, vomiting, diarrhea  or change in bowel habits or change in bladder habits, change in stools or change in urine, dysuria, hematuria,  rash, arthralgias, visual complaints, headache, numbness, weakness or ataxia or problems with walking or coordination,  change in mood or  memory.        Current Meds  Medication Sig   albuterol (VENTOLIN HFA) 108 (90 Base) MCG/ACT inhaler INHALE 2 PUFFS INTO THE LUNGS EVERY 6 HOURS AS NEEDED FOR WHEEZING (COUGH).   cetirizine (ZYRTEC) 10 MG tablet Take 10 mg by mouth daily.   EPINEPHrine 0.3 mg/0.3 mL IJ SOAJ injection See admin instructions.   montelukast (SINGULAIR) 10 MG tablet Take 1 tablet (10 mg total) by mouth at bedtime.   omeprazole (PRILOSEC) 40 MG capsule TAKE ONE CAPSULE BY MOUTH ONCE DAILY, 30 MINUTES PRIOR TO EVENING MEAL.                    Physical Examination    05/06/2021       238   04/06/2021       236  10/03/20 232 lb (105.2 kg)  08/26/20 229 lb 6.4 oz (104.1 kg)  07/31/20 204 lb (92.5 kg)     Vital signs reviewed  05/06/2021  - Note at rest 02 sats  96% on RA   General appearance:    amb wm cough with voice use    HEENT : pt wearing mask not removed for exam due to covid -19 concerns.    NECK :  without JVD/Nodes/TM/ nl carotid upstrokes bilaterally   LUNGS: no acc muscle use,  Nl contour chest which is clear to A and P bilaterally without cough on insp or exp maneuvers   CV:  RRR  no s3 or murmur or increase in P2, and no  edema   ABD:  soft and nontender with nl inspiratory excursion in the supine position. No bruits or organomegaly appreciated, bowel sounds nl  MS:  Nl gait/ ext warm without deformities, calf tenderness, cyanosis or clubbing No obvious joint restrictions   SKIN: warm and dry without lesions    NEURO:  alert, approp, nl sensorium with  no motor or cerebellar deficits apparent.

## 2021-05-06 NOTE — Assessment & Plan Note (Addendum)
Onset of sinus problems about 2014 "kept getting infections"responded to abx > sinus sugery around 2016  Polyps removed and started shots x about the same time for about a year > cough no better and stopped allergy shots from ENT doctor p a year >  Dec 2021 "all clear" per Willeen Cass [place] - [place] sinus 06/2620 nl  -  Allergy profile 09/01/20 >  Eos 0.1 /  IgE 10  -  Cyclical cough rx 10/03/2020  ? 70% better until warmer weather then worse - 03/02/2021 start sigulair with 12 days pred then re-eval in 4 weeks with all meds in hand> no better so try prednsione x 12 days and symb 80 > got proair instead and didn't work so 05/06/2021 repeat pred x 12 d and start sym 80 2bid right away  - The proper method of use, as well as anticipated side effects, of a metered-dose inhaler were discussed and demonstrated to the patient using teach back method. Improved effectiveness after extensive coaching during this visit to a level of approximately 90 % from a baseline of 80 %     F/u in 6-8 weeks, sooner prn          Each maintenance medication was reviewed in detail including emphasizing most importantly the difference between maintenance and prns and under what circumstances the prns are to be triggered using an action plan format where appropriate.  Total time for H and P, chart review, counseling, reviewing hfa  device(s) and generating customized AVS unique to this office visit / same day charting = 25 min

## 2021-05-06 NOTE — Patient Instructions (Signed)
Start Symbicort 80 Take 2 puffs first thing in am and then another 2 puffs about 12 hours later.   Work on inhaler technique:  relax and gently blow all the way out then take a nice smooth full deep breath back in, triggering the inhaler at same time you start breathing in.  Hold for up to 5 seconds if you can. Blow out thru nose. Rinse and gargle with water when done.  If mouth or throat bother you at all,  try brushing teeth/gums/tongue with arm and hammer toothpaste/ make a slurry and gargle and spit out.   Resume Try prilosec (omeprazole) 40mg   Take 30-60 min before first meal of the day and Pepcid ac (famotidine) 20 mg one after supper  until  return  GERD (REFLUX)  is an extremely common cause of respiratory symptoms just like yours , many times with no obvious heartburn at all.    It can be treated with medication, but also with lifestyle changes including elevation of the head of your bed (ideally with 6 -8inch blocks under the headboard of your bed),  Smoking cessation, avoidance of late meals, excessive alcohol, and avoid fatty foods, chocolate, peppermint, colas, red wine, and acidic juices such as orange juice.  NO MINT OR MENTHOL PRODUCTS SO NO COUGH DROPS  USE SUGARLESS CANDY INSTEAD (Jolley ranchers or Stover's or Life Savers) or even ice chips will also do - the key is to swallow to prevent all throat clearing. NO OIL BASED VITAMINS - use powdered substitutes.  Avoid fish oil when coughing.   Please schedule a follow up office visit in 6  weeks, sooner if needed  with all medications /inhalers/ solutions in hand so we can verify exactly what you are taking. This includes all medications from all doctors and over the counters

## 2021-07-06 ENCOUNTER — Other Ambulatory Visit: Payer: Self-pay

## 2021-07-06 ENCOUNTER — Ambulatory Visit: Payer: BC Managed Care – PPO | Admitting: Internal Medicine

## 2021-07-06 ENCOUNTER — Encounter: Payer: Self-pay | Admitting: Internal Medicine

## 2021-07-06 DIAGNOSIS — J45991 Cough variant asthma: Secondary | ICD-10-CM | POA: Diagnosis not present

## 2021-07-06 MED ORDER — BUDESONIDE-FORMOTEROL FUMARATE 160-4.5 MCG/ACT IN AERO: INHALATION_SPRAY | RESPIRATORY_TRACT | 12 refills | Status: DC

## 2021-07-06 NOTE — Patient Instructions (Addendum)
Start Symbicort 160  (dulera 200) Take 2 puffs first thing in am and then another 2 puffs about 12 hours later.   Work on inhaler technique:  relax and gently blow all the way out then take a nice smooth full deep breath back in, triggering the inhaler at same time you start breathing in.  Hold for up to 5 seconds if you can. Blow out thru nose. Rinse and gargle with water when done.  If mouth or throat bother you at all,  try brushing teeth/gums/tongue with arm and hammer toothpaste/ make a slurry and gargle and spit out.   Continue prilosec (omeprazole) 40mg   Take 30-60 min before first meal of the day and Pepcid ac (famotidine) 20 mg one after supper  until  return   For drainage / throat tickle stop zyrtec and try take CHLORPHENIRAMINE  4 mg  (Chlortab 4mg   at should be easiest to find in the green box)  take one every 4 hours as needed - available over the counter- may cause drowsiness so start with just a dose or two after supper or bedtime   Please schedule a follow up visit in 3 months but call sooner if needed   If not happy call for methacholine challenge test in meantime  Add :  cxr rec

## 2021-07-06 NOTE — Assessment & Plan Note (Signed)
Onset of sinus problems about 2014 "kept getting infections"responded to abx > sinus sugery around 2016  Polyps removed and started shots x about the same time for about a year > cough no better and stopped allergy shots from ENT doctor p a year >  Dec 2021 "all clear" per Willeen Cass [place] - CT sinus 06/2620 nl  -  Allergy profile 09/01/20 >  Eos 0.1 /  IgE 10  -  Cyclical cough rx 10/03/2020  ? 70% better until warmer weather then worse - 03/02/2021 start sigulair with 12 days pred then re-eval in 4 weeks with all meds in hand> no better so try prednsione x 12 days and symb 80 > got proair instead and didn't work so 05/06/2021 repeat pred x 12 d and start sym 80 2bid right away - 07/06/2021 coughed again off pred so rec increaese symbicort to 160 2bid  Then mct next step if cough recurs on high dose ICS  If not responding to high dose steroids with neg MCT then next stop would be gabapentin trial.  Discussed in detail all the  indications, usual  risks and alternatives  relative to the benefits with patient who agrees to proceed with Rx as outlined.             Each maintenance medication was reviewed in detail including emphasizing most importantly the difference between maintenance and prns and under what circumstances the prns are to be triggered using an action plan format where appropriate.  Total time for H and P, chart review, counseling, and generating customized AVS unique to this office visit / same day charting = 25 min

## 2021-07-06 NOTE — Progress Notes (Signed)
@Patient  ID: Jackson Matthews, male    DOB: 05/14/1966    MRN: [id number]     HPI:  55 year old male never smoker c chronic cough and history of COVID-19 -October/2021.  Did not require hospitalization.  Did not receive monoclonal antibody infusion but rx Prednisone x 4 days cough gone then recurred.        08/26/2020  - Visit with NP   55 year old never smoker followed in our office for cough variant asthma and chronic cough.  He was last seen in our office in [place] by Dr. Derby in January/2021.  This was via a telephone visit.  He was encouraged to continue follow-up with ENT as well as to continue Qvar and as needed albuterol.  Since last being seen patient has unfortunately contracted COVID-19.  This was in October/2021.  He was seen in urgent care and treated with azithromycin as well as prednisone in the outpatient setting.  Chest x-ray from 07/31/2020 shows patchy airspace opacity consistent with COVID-19 pneumonia. Imp/Cough variant asthma Plan: Resume Spiriva Respimat 2.5 Repeat CBC with diff Eos 0.1 nd Ige 10    Tessalon Perles   Obtain pulmonary function testing > not done      10/03/2020  1st ov/Rasul [place]ola New/ consultation re cough x 2016  Dyspnea:  No doe including steps  Cough: 30 min p stir around  Sleeping: zyrtec at bedtime and sleep fine @ 10 degrees and coiugh  does not wake him up    SABA use: albuterol 02: no desats  Constant urge to clear throat daytime despite ent eval "nl" 08/2020   Rec Prednisone 10 mg Take 4 for three days 3 for three days 2 for three days 1 for three days and stop  Singulair 10 mg each pm  For cough > delsym 2 tsp every 12 hours as needed  GERD  die  Follow up one month in [place] office with all active medications including over the counter meds   Televisit 03/02/21 Prednisone 10 mg Take 4 for three days 3 for three days 2 for three days 1 for three days and stop  Singulair 10 mg each pm  For cough > delsym 2 tsp every 12 hours  as needed     04/06/2021  f/u ov/[place] office/Mischele Detter re: cough 2016  Chief Complaint  Patient presents with   Follow-up    Cough has improved slightly since the last visit. He states cough is non prod and he feels constant sensation that something is in his throat. He rarely uses his albuterol.   Dyspnea:  only sob if coughing  Cough: mostly going outside  Sleeping: doesn't disturb sleep SABA use: none 02: none  Covid status: had likely delta oct 2021 Lung cancer screening: never smoker  Rec Start Symbicort 80 Take 2 puffs first thing in am and then another 2 puffs about 12 hours later.  Work on inhaler technique:  Resume Try prilosec (omeprazole) 40mg   Take 30-60 min before first meal of the day and Pepcid ac (famotidine) 20 mg one after supper  until  return GERD diet reviewed, bed blocks rec  Please schedule a follow up office visit in 4 weeks, sooner if needed  with all medications /inhalers/ solutions in hand   05/06/2021  f/u ov/[place] office/Alishea Beaudin re: cough x 2016  pred responsive and was supposed to maint on symb 80 but got proair instead  No chief complaint on file.  Dyspnea:  Not limited by breathing from desired activities  Cough: worse when outside - only better on prednisone Sleeping: no resp problems SABA use: no better  02: none  Covid status: never vax / infected  Rec Start Symbicort 80 Take 2 puffs first thing in am and then another 2 puffs about 12 hours later.  Work on inhaler technique:  Resume Try prilosec (omeprazole) 40mg   Take 30-60 min before first meal of the day and Pepcid ac (famotidine) 20 mg one after supper  until  return GERD diet reviewed, bed blocks rec  Please schedule a follow up office visit in 6  weeks, sooner if needed  with all medications /inhalers/ solutions in hand     07/06/2021  f/u ov/[place] office/Brayn Eckstein re: cough variant  maint on symbicort 80 and singulair  95% better only while on prednisone then 3 days later coughing  again  Dyspnea:  works out s sob or cough  Cough: daytime never noct / worse in am's ? Some better in afternoon Sleeping: no cough / 3 pillows 30 degrees  SABA use: none  02: none  Covid status: never vax/ infected with the alpha    No obvious day to day or daytime variability or assoc excess/ purulent sputum or mucus plugs or hemoptysis or cp or chest tightness, subjective wheeze or overt sinus or hb symptoms.   Sleeping  without nocturnal  or early am exacerbation  of respiratory  c/o's or need for noct saba. Also denies any obvious fluctuation of symptoms with weather or environmental changes or other aggravating or alleviating factors except as outlined above   No unusual exposure hx or h/o childhood pna/ asthma or knowledge of premature birth.  Current Allergies, Complete Past Medical History, Past Surgical History, Family History, and Social History were reviewed in 09/05/2021 record.  ROS  The following are not active complaints unless bolded Hoarseness, sore throat, dysphagia, dental problems, itching, sneezing,  nasal congestion or discharge of excess mucus or purulent secretions, ear ache,   fever, chills, sweats, unintended wt loss or wt gain, classically pleuritic or exertional cp,  orthopnea pnd or arm/hand swelling  or leg swelling, presyncope, palpitations, abdominal pain, anorexia, nausea, vomiting, diarrhea  or change in bowel habits or change in bladder habits, change in stools or change in urine, dysuria, hematuria,  rash, arthralgias, visual complaints, headache, numbness, weakness or ataxia or problems with walking or coordination,  change in mood or  memory.        Current Meds  Medication Sig   albuterol (VENTOLIN HFA) 108 (90 Base) MCG/ACT inhaler INHALE 2 PUFFS INTO THE LUNGS EVERY 6 HOURS AS NEEDED FOR WHEEZING (COUGH).   budesonide-formoterol (SYMBICORT) 80-4.5 MCG/ACT inhaler Take 2 puffs first thing in am and then another 2 puffs about 12  hours later.   cetirizine (ZYRTEC) 10 MG tablet Take 10 mg by mouth daily.   EPINEPHrine 0.3 mg/0.3 mL IJ SOAJ injection See admin instructions.   montelukast (SINGULAIR) 10 MG tablet Take 1 tablet (10 mg total) by mouth at bedtime.   omeprazole (PRILOSEC) 40 MG capsule TAKE ONE CAPSULE BY MOUTH ONCE DAILY, 30 MINUTES PRIOR TO EVENING MEAL.   predniSONE (DELTASONE) 10 MG tablet Take 4 for three days 3 for three days 2 for three days 1 for three days and stop                         Physical Examination   07/06/2021     228  05/06/2021       238   04/06/2021       236  10/03/20 232 lb (105.2 kg)  08/26/20 229 lb 6.4 oz (104.1 kg)  07/31/20 204 lb (92.5 kg)     Vital signs reviewed  07/06/2021  - Note at rest 02 sats  97% on RA   General appearance:    amb pleasant wm incessant dry cough    A few pops in bases on insp but no cough       HEENT : pt wearing mask not removed for exam due to covid -19 concerns.    NECK :  without JVD/Nodes/TM/ nl carotid upstrokes bilaterally   LUNGS: no acc muscle use,  Nl contour chest a few pops and squeaks in bases  bilaterally without cough on insp maneuvers   CV:  RRR  no s3 or murmur or increase in P2, and no edema   ABD:  soft and nontender with nl inspiratory excursion in the supine position. No bruits or organomegaly appreciated, bowel sounds nl  MS:  Nl gait/ ext warm without deformities, calf tenderness, cyanosis or clubbing No obvious joint restrictions   SKIN: warm and dry without lesions    NEURO:  alert, approp, nl sensorium with  no motor or cerebellar deficits apparent.     CXR PA and Lateral:   07/06/2021 :    I personally reviewed images and agree with radiology impression as follows:    Did not go for cxr as rec

## 2021-07-16 DIAGNOSIS — J019 Acute sinusitis, unspecified: Secondary | ICD-10-CM | POA: Diagnosis not present

## 2021-08-14 ENCOUNTER — Other Ambulatory Visit: Payer: Self-pay | Admitting: Internal Medicine

## 2021-08-14 DIAGNOSIS — R0602 Shortness of breath: Secondary | ICD-10-CM

## 2021-11-20 DIAGNOSIS — J019 Acute sinusitis, unspecified: Secondary | ICD-10-CM | POA: Diagnosis not present

## 2022-06-01 ENCOUNTER — Other Ambulatory Visit: Payer: Self-pay | Admitting: *Deleted

## 2022-06-01 MED ORDER — MONTELUKAST SODIUM 10 MG PO TABS: 10.0000 mg | ORAL_TABLET | Freq: Every day | ORAL | 0 refills | Status: AC

## 2022-06-07 DIAGNOSIS — R0982 Postnasal drip: Secondary | ICD-10-CM | POA: Diagnosis not present

## 2022-06-09 DIAGNOSIS — J0100 Acute maxillary sinusitis, unspecified: Secondary | ICD-10-CM | POA: Diagnosis not present

## 2022-06-29 DIAGNOSIS — J0100 Acute maxillary sinusitis, unspecified: Secondary | ICD-10-CM | POA: Diagnosis not present

## 2022-10-04 IMAGING — CT CT HEAD W/O CM
3 series · 15 of 47 positions shown, 18 images · non-contrast
Comparison: 03/11/2015

CLINICAL DATA: Headache

EXAM:
CT HEAD WITHOUT CONTRAST
TECHNIQUE: Contiguous axial images were obtained from the base of the skull
through the vertex without intravenous contrast.

[Series 2: head wo · axial · 0.43mm/px · z∈[-107,+38]mm · 9 of 35 slices shown, 12 images]
[im 3/35  brain]
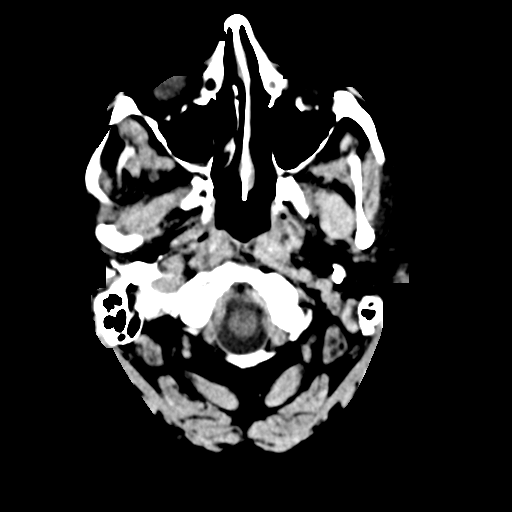
[im 3/35  bone]
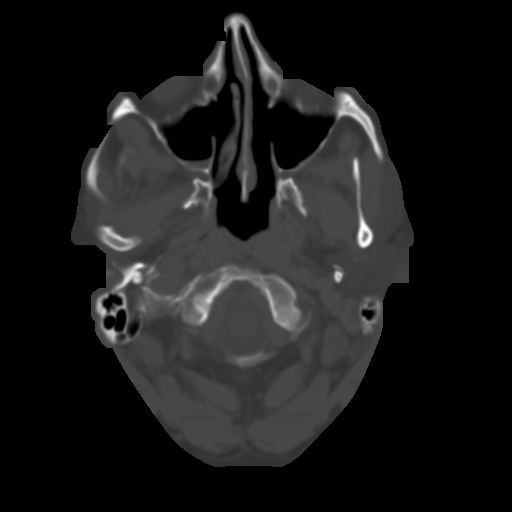
[im 6/35  brain]
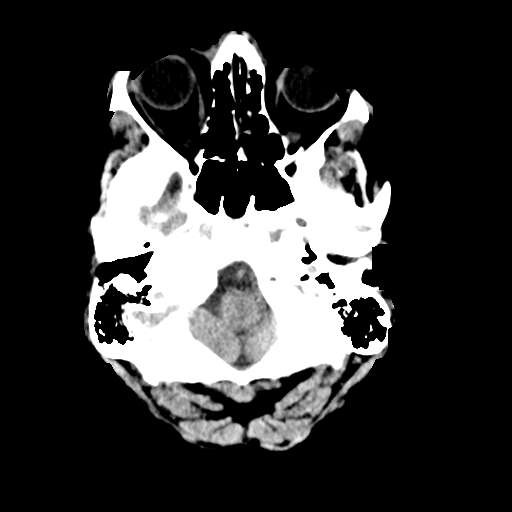
[im 10/35  brain]
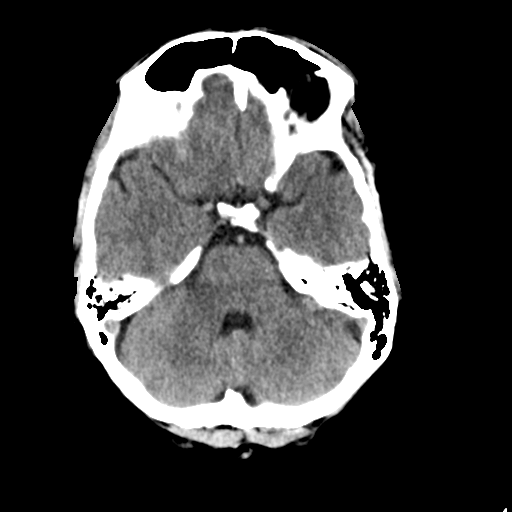
[im 13/35  brain]
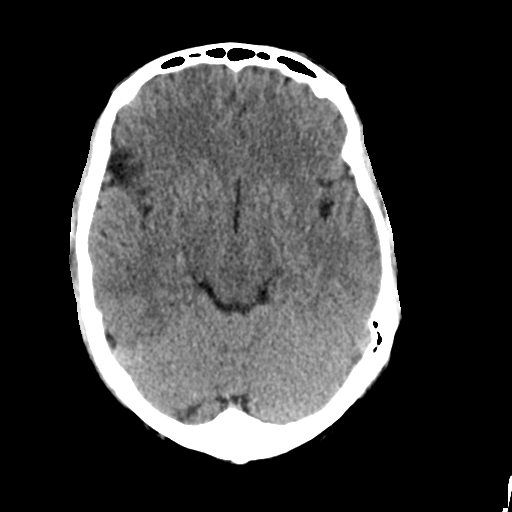
[im 18/35  brain]
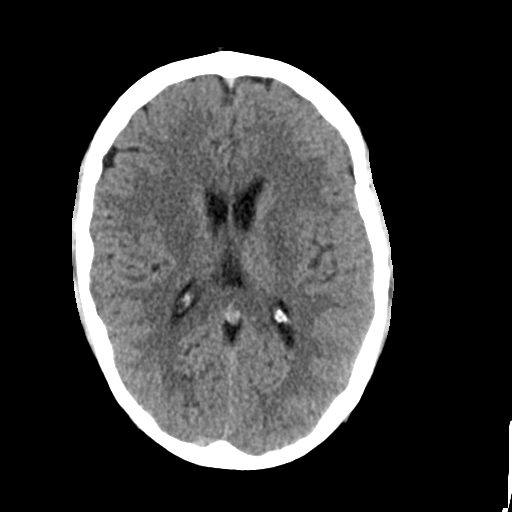
[im 18/35  bone]
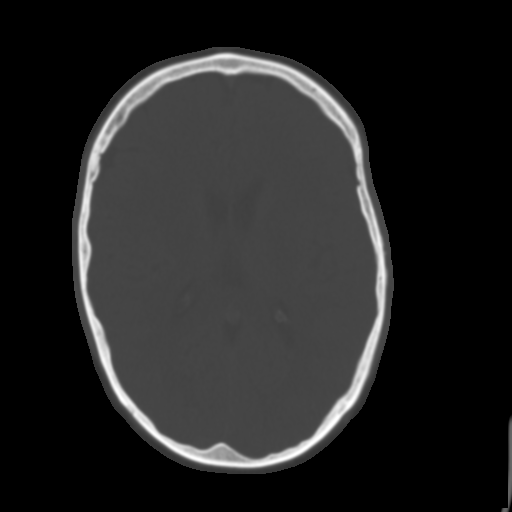
[im 22/35  brain]
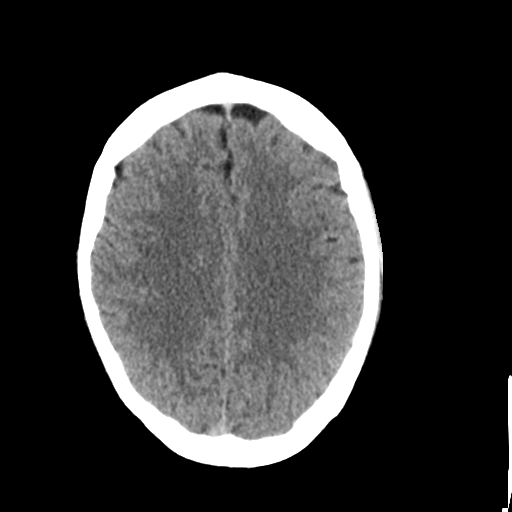
[im 25/35  brain]
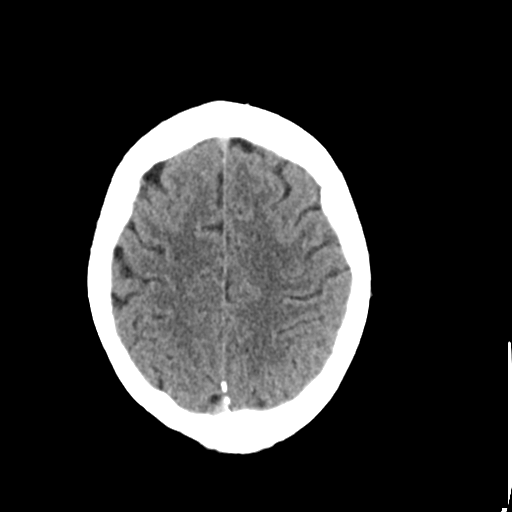
[im 29/35  brain]
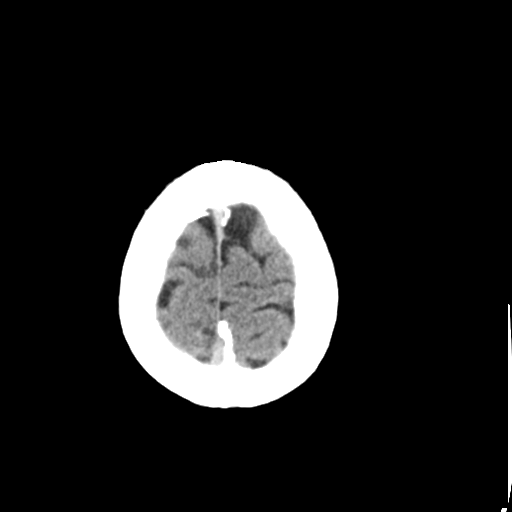
[im 32/35  brain]
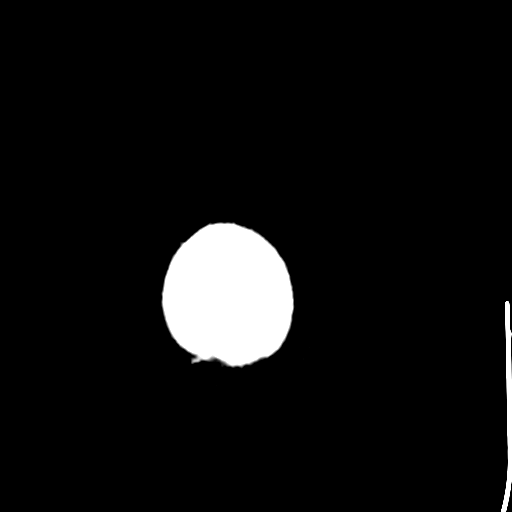
[im 32/35  bone]
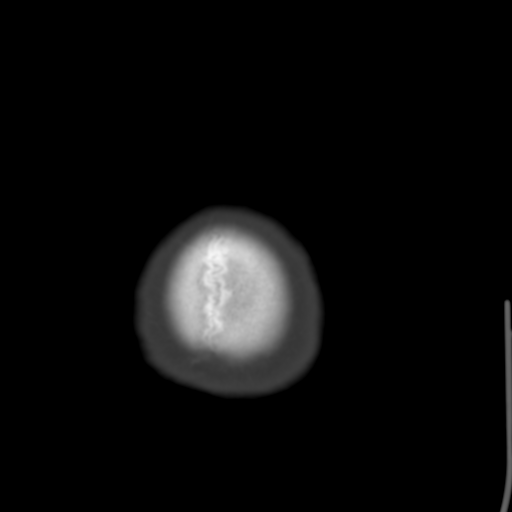

[Series 4: coronal soft tissue · coronal · 0.33mm/px · 3 of 75 slices shown]
[im 25/75  brain]
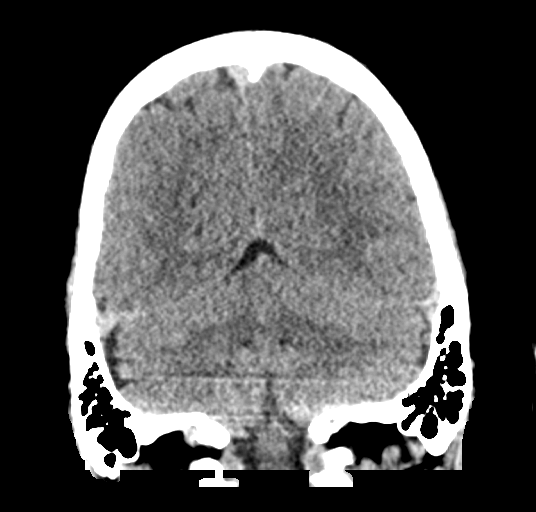
[im 33/75  brain]
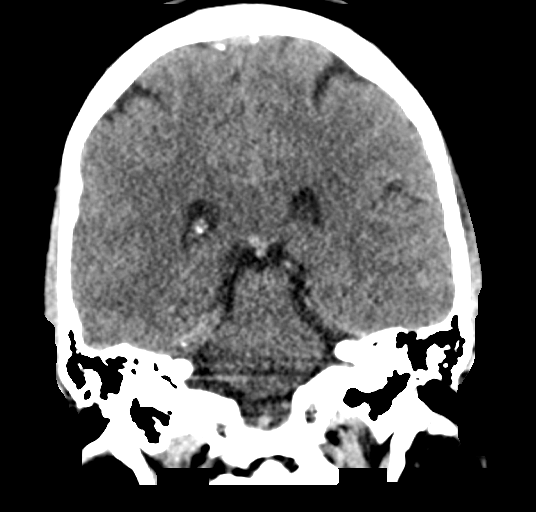
[im 42/75  brain]
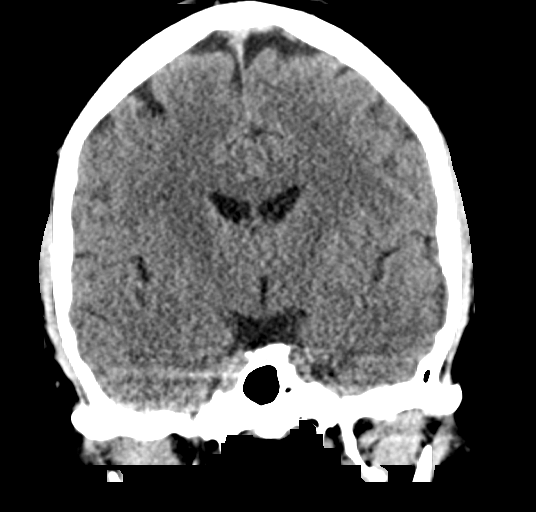

[Series 5: sagittal soft tissue · sagittal · 0.34mm/px · 3 of 58 slices shown]
[im 20/58  brain]
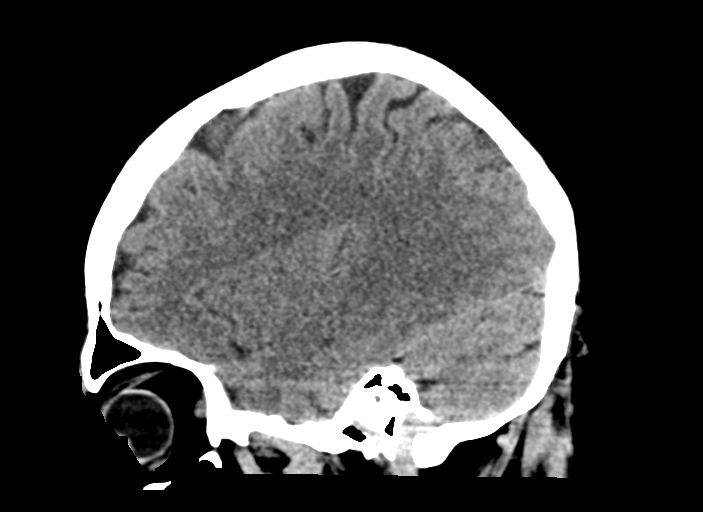
[im 29/58  brain]
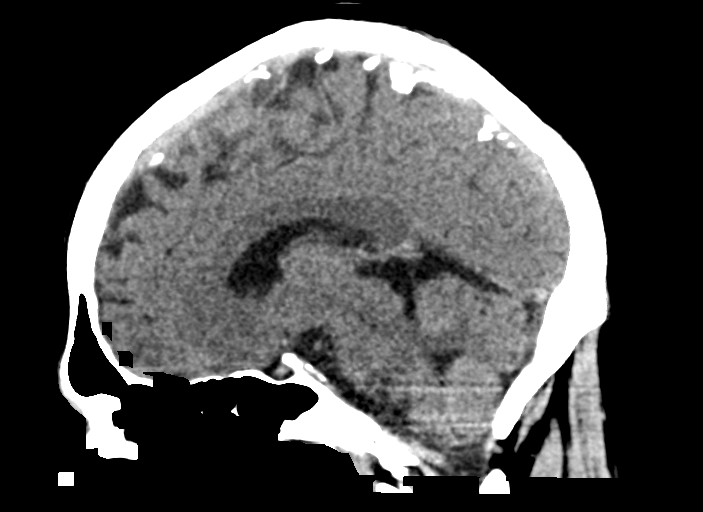
[im 39/58  brain]
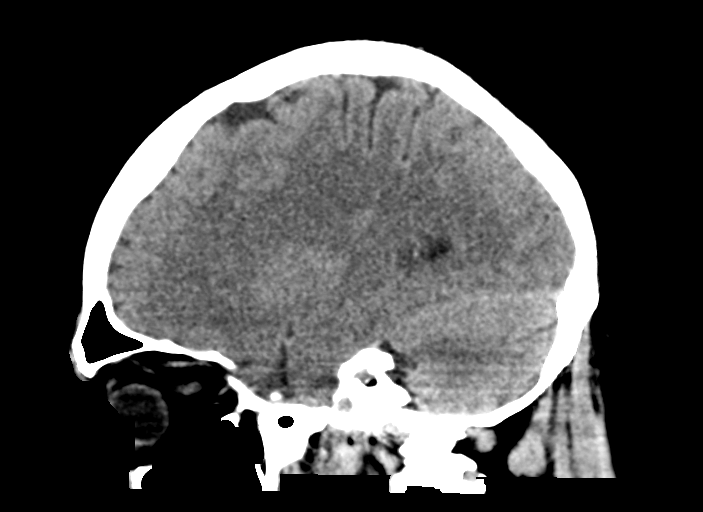

[15 of 47 positions shown; findings below may reference images not displayed]

FINDINGS: Brain: There is no mass, hemorrhage or extra-axial collection. The
size and configuration of the ventricles and extra-axial CSF spaces
are normal. The brain parenchyma is normal, without acute or chronic
infarction.

Vascular: No abnormal hyperdensity of the major intracranial
arteries or dural venous sinuses. No intracranial atherosclerosis.

Skull: The visualized skull base, calvarium and extracranial soft
tissues are normal.

Sinuses/Orbits: No fluid levels or advanced mucosal thickening of
the visualized paranasal sinuses. No mastoid or middle ear effusion.
The orbits are normal.
IMPRESSION: Normal head CT.

## 2022-11-05 DIAGNOSIS — J019 Acute sinusitis, unspecified: Secondary | ICD-10-CM | POA: Diagnosis not present

## 2022-11-09 ENCOUNTER — Encounter: Payer: Self-pay | Admitting: Physician Assistant

## 2022-11-09 ENCOUNTER — Telehealth: Payer: BC Managed Care – PPO | Admitting: Physician Assistant

## 2022-11-09 DIAGNOSIS — B9689 Other specified bacterial agents as the cause of diseases classified elsewhere: Secondary | ICD-10-CM

## 2022-11-09 DIAGNOSIS — J019 Acute sinusitis, unspecified: Secondary | ICD-10-CM

## 2022-11-09 MED ORDER — CEFPROZIL 500 MG PO TABS: 500.0000 mg | ORAL_TABLET | Freq: Two times a day (BID) | ORAL | 0 refills | Status: DC

## 2022-11-09 NOTE — Progress Notes (Signed)
Virtual Visit Consent   Jackson Matthews, you are scheduled for a virtual visit with a [facility] provider today. Just as with appointments in the office, your consent must be obtained to participate. Your consent will be active for this visit and any virtual visit you may have with one of our providers in the next 365 days. If you have a MyChart account, a copy of this consent can be sent to you electronically.  As this is a virtual visit, video technology does not allow for your provider to perform a traditional examination. This may limit your provider's ability to fully assess your condition. If your provider identifies any concerns that need to be evaluated in person or the need to arrange testing (such as labs, EKG, etc.), we will make arrangements to do so. Although advances in technology are sophisticated, we cannot ensure that it will always work on either your end or our end. If the connection with a video visit is poor, the visit may have to be switched to a telephone visit. With either a video or telephone visit, we are not always able to ensure that we have a secure connection.  By engaging in this virtual visit, you consent to the provision of healthcare and authorize for your insurance to be billed (if applicable) for the services provided during this visit. Depending on your insurance coverage, you may receive a charge related to this service.  I need to obtain your verbal consent now. Are you willing to proceed with your visit today? Jackson Matthews has provided verbal consent on 11/09/2022 for a virtual visit (video or telephone). Leeanne Rio, [place]  Date: 11/09/2022 7:15 PM  Virtual Visit via Video Note   I, Leeanne Rio, connected with  Jackson Matthews  ([id number], 07-10-1966) on 11/09/22 at  7:15 PM EST by a video-enabled telemedicine application and verified that I am speaking with the correct person using two identifiers.  Location: Patient: Virtual Visit  Location Patient: Home Provider: Virtual Visit Location Provider: Home Office   I discussed the limitations of evaluation and management by telemedicine and the availability of in person appointments. The patient expressed understanding and agreed to proceed.    History of Present Illness: Jackson Matthews is a 57 y.o. who identifies as a male who was assigned male at birth, and is being seen today for URI symptoms starting about 2 weeks ago with nasal and head congestion, sinus pressure now with sinus pain and thick nasal discharge. Denies fever. Was given short course of plain Amoxicillin without improvement. Tried to contact his ENT but cannot be seen until mid-march. Due to history of more substantial sinusitis, he notes his ENT usually gives him a course of Cefprozil to help eradicate infection.  HPI: HPI  Problems:  Patient Active Problem List   Diagnosis Date Noted   Cough variant asthma 08/26/2020   History of COVID-19 08/26/2020    Allergies: No Known Allergies Medications:  Current Outpatient Medications:    cefPROZIL (CEFZIL) 500 MG tablet, Take 1 tablet (500 mg total) by mouth 2 (two) times daily., Disp: 14 tablet, Rfl: 0   albuterol (VENTOLIN HFA) 108 (90 Base) MCG/ACT inhaler, INHALE 2 PUFFS INTO THE LUNGS EVERY 6 HOURS AS NEEDED FOR WHEEZING (COUGH)., Disp: 8.5 each, Rfl: 3   budesonide-formoterol (SYMBICORT) 160-4.5 MCG/ACT inhaler, Take 2 puffs first thing in am and then another 2 puffs about 12 hours later., Disp: 1 each, Rfl: 12   cetirizine (ZYRTEC) 10  MG tablet, Take 10 mg by mouth daily., Disp: , Rfl:    EPINEPHrine 0.3 mg/0.3 mL IJ SOAJ injection, See admin instructions., Disp: , Rfl:    montelukast (SINGULAIR) 10 MG tablet, Take 1 tablet (10 mg total) by mouth at bedtime., Disp: 30 tablet, Rfl: 0   omeprazole (PRILOSEC) 40 MG capsule, TAKE ONE CAPSULE BY MOUTH ONCE DAILY, 30 MINUTES PRIOR TO EVENING MEAL., Disp: , Rfl:   Observations/Objective: Patient is  well-developed, well-nourished in no acute distress.  Resting comfortably at home.  Head is normocephalic, atraumatic.  No labored breathing. Speech is clear and coherent with logical content.  Patient is alert and oriented at baseline.  Assessment and Plan: 1. Acute bacterial sinusitis - cefPROZIL (CEFZIL) 500 MG tablet; Take 1 tablet (500 mg total) by mouth 2 (two) times daily.  Dispense: 14 tablet; Refill: 0  Rx Cefprozil.  Increase fluids.  Rest.  Saline nasal spray.  Probiotic.  Mucinex as directed.  Humidifier in bedroom. Resume [place]. Continue navage and OTC medications.  Call or return to clinic if symptoms are not improving.   Follow Up Instructions: I discussed the assessment and treatment plan with the patient. The patient was provided an opportunity to ask questions and all were answered. The patient agreed with the plan and demonstrated an understanding of the instructions.  A copy of instructions were sent to the patient via MyChart unless otherwise noted below.   The patient was advised to call back or seek an in-person evaluation if the symptoms worsen or if the condition fails to improve as anticipated.  Time:  I spent 10 minutes with the patient via telehealth technology discussing the above problems/concerns.    Leeanne Rio, PA-C

## 2022-11-09 NOTE — Patient Instructions (Signed)
Nedra Hai, thank you for joining Leeanne Rio, PA-C for today's virtual visit.  While this provider is not your primary care provider (PCP), if your PCP is located in our provider database this encounter information will be shared with them immediately following your visit.   [facility] account gives you access to today's visit and all your visits, tests, and labs performed at [facility] " click here if you don't have a [facility] account or go to [email or web address]  Consent: (Patient) Jackson Matthews provided verbal consent for this virtual visit at the beginning of the encounter.  Current Medications:  Current Outpatient Medications:    albuterol (VENTOLIN HFA) 108 (90 Base) MCG/ACT inhaler, INHALE 2 PUFFS INTO THE LUNGS EVERY 6 HOURS AS NEEDED FOR WHEEZING (COUGH)., Disp: 8.5 each, Rfl: 3   budesonide-formoterol (SYMBICORT) 160-4.5 MCG/ACT inhaler, Take 2 puffs first thing in am and then another 2 puffs about 12 hours later., Disp: 1 each, Rfl: 12   cetirizine (ZYRTEC) 10 MG tablet, Take 10 mg by mouth daily., Disp: , Rfl:    EPINEPHrine 0.3 mg/0.3 mL IJ SOAJ injection, See admin instructions., Disp: , Rfl:    montelukast (SINGULAIR) 10 MG tablet, Take 1 tablet (10 mg total) by mouth at bedtime., Disp: 30 tablet, Rfl: 0   omeprazole (PRILOSEC) 40 MG capsule, TAKE ONE CAPSULE BY MOUTH ONCE DAILY, 30 MINUTES PRIOR TO EVENING MEAL., Disp: , Rfl:    predniSONE (DELTASONE) 10 MG tablet, Take 4 for three days 3 for three days 2 for three days 1 for three days and stop, Disp: 30 tablet, Rfl: 0   Medications ordered in this encounter:  No orders of the defined types were placed in this encounter.    *If you need refills on other medications prior to your next appointment, please contact your pharmacy*  Follow-Up: Call back or seek an in-person evaluation if the symptoms worsen or if the condition fails to improve as anticipated.  [place] [phone]  Other Instructions Please take antibiotic as directed.  Increase fluid intake.  Use Saline nasal spray.  Take a daily multivitamin. Continue your OTC medications.  Place a humidifier in the bedroom.  Please call or return clinic if symptoms are not improving.  Sinusitis Sinusitis is redness, soreness, and swelling (inflammation) of the paranasal sinuses. Paranasal sinuses are air pockets within the bones of your face (beneath the eyes, the middle of the forehead, or above the eyes). In healthy paranasal sinuses, mucus is able to drain out, and air is able to circulate through them by way of your nose. However, when your paranasal sinuses are inflamed, mucus and air can become trapped. This can allow bacteria and other germs to grow and cause infection. Sinusitis can develop quickly and last only a short time (acute) or continue over a long period (chronic). Sinusitis that lasts for more than 12 weeks is considered chronic.  CAUSES  Causes of sinusitis include: Allergies. Structural abnormalities, such as displacement of the cartilage that separates your nostrils (deviated septum), which can decrease the air flow through your nose and sinuses and affect sinus drainage. Functional abnormalities, such as when the small hairs (cilia) that line your sinuses and help remove mucus do not work properly or are not present. SYMPTOMS  Symptoms of acute and chronic sinusitis are the same. The primary symptoms are pain and pressure around the affected sinuses. Other symptoms include: Upper toothache. Earache. Headache. Bad breath. Decreased  sense of smell and taste. A cough, which worsens when you are lying flat. Fatigue. Fever. Thick drainage from your nose, which often is green and may contain pus (purulent). Swelling and warmth over the affected sinuses. DIAGNOSIS  Your caregiver will perform a physical exam. During the exam, your caregiver may: Look in your nose  for signs of abnormal growths in your nostrils (nasal polyps). Tap over the affected sinus to check for signs of infection. View the inside of your sinuses (endoscopy) with a special imaging device with a light attached (endoscope), which is inserted into your sinuses. If your caregiver suspects that you have chronic sinusitis, one or more of the following tests may be recommended: Allergy tests. Nasal culture A sample of mucus is taken from your nose and sent to a lab and screened for bacteria. Nasal cytology A sample of mucus is taken from your nose and examined by your caregiver to determine if your sinusitis is related to an allergy. TREATMENT  Most cases of acute sinusitis are related to a viral infection and will resolve on their own within 10 days. Sometimes medicines are prescribed to help relieve symptoms (pain medicine, decongestants, nasal steroid sprays, or saline sprays).  However, for sinusitis related to a bacterial infection, your caregiver will prescribe antibiotic medicines. These are medicines that will help kill the bacteria causing the infection.  Rarely, sinusitis is caused by a fungal infection. In theses cases, your caregiver will prescribe antifungal medicine. For some cases of chronic sinusitis, surgery is needed. Generally, these are cases in which sinusitis recurs more than 3 times per year, despite other treatments. HOME CARE INSTRUCTIONS  Drink plenty of water. Water helps thin the mucus so your sinuses can drain more easily. Use a humidifier. Inhale steam 3 to 4 times a day (for example, sit in the bathroom with the shower running). Apply a warm, moist washcloth to your face 3 to 4 times a day, or as directed by your caregiver. Use saline nasal sprays to help moisten and clean your sinuses. Take over-the-counter or prescription medicines for pain, discomfort, or fever only as directed by your caregiver. SEEK IMMEDIATE MEDICAL CARE IF: You have increasing pain or  severe headaches. You have nausea, vomiting, or drowsiness. You have swelling around your face. You have vision problems. You have a stiff neck. You have difficulty breathing. MAKE SURE YOU:  Understand these instructions. Will watch your condition. Will get help right away if you are not doing well or get worse. Document Released: 09/13/2005 Document Revised: 12/06/2011 Document Reviewed: 09/28/2011 [facility] Patient Information 2014 [place], [place].    If you have been instructed to have an in-person evaluation today at a local Urgent Care facility, please use the link below. It will take you to a list of all of our available [facility], including address, phone number and hours of operation. Please do not delay care.  [facility]  If you or a family member do not have a primary care provider, use the link below to schedule a visit and establish care. When you choose a [facility] primary care physician or advanced practice provider, you gain a long-term partner in health. Find a Primary Care Provider  Learn more about [facility]'s in-office and virtual care options: [facility]

## 2023-01-06 ENCOUNTER — Ambulatory Visit: Payer: BC Managed Care – PPO | Admitting: Nurse Practitioner

## 2023-01-06 ENCOUNTER — Encounter: Payer: Self-pay | Admitting: Nurse Practitioner

## 2023-01-06 VITALS — BP 114/74 | HR 99 | Temp 98.4°F | Ht 72.0 in | Wt 237.4 lb

## 2023-01-06 DIAGNOSIS — J45991 Cough variant asthma: Secondary | ICD-10-CM

## 2023-01-06 DIAGNOSIS — R5383 Other fatigue: Secondary | ICD-10-CM

## 2023-01-06 DIAGNOSIS — J302 Other seasonal allergic rhinitis: Secondary | ICD-10-CM

## 2023-01-06 NOTE — Patient Instructions (Signed)
Labs ordered    Follow up as needed

## 2023-01-06 NOTE — Assessment & Plan Note (Signed)
Stable at present.   

## 2023-01-06 NOTE — Assessment & Plan Note (Addendum)
Will check labs today.  Advised patient to follow-up healthy diet and regular exercise schedule.

## 2023-01-06 NOTE — Progress Notes (Signed)
New Patient Office Visit  Subjective    Patient ID: Jackson Matthews, male    DOB: [birth year]  Age: 57 y.o. MRN: [id number]  CC:  Chief Complaint  Patient presents with   Establish Care    HPI Jackson Matthews presents to establish care. He has history  He works at [employer].   He complains of being fatigued lately more during the evening time.  No sensitivity to hot and cold.   He is a non-smoker, no alcohol or recreational drug use. He is not on a regular prescription medication.  He takes montelukast as needed for allergies.  He did not have coloscopy  and would like think if want to go for colonoscopy or Cologuard  Health Maintenance  Topic Date Due   HIV Screening  Never done   Hepatitis C Screening  Never done   DTaP/Tdap/Td (1 - Tdap) Never done   COLONOSCOPY (Pts 45-93yrs Insurance coverage will need to be confirmed)  Never done   Zoster Vaccines- Shingrix (1 of 2) Never done   COVID-19 Vaccine (1) 01/22/2023 (Originally 06/16/1966)   INFLUENZA VACCINE  04/28/2023   HPV VACCINES  Aged Out    There are no preventive care reminders to display for this patient.  Outpatient Encounter Medications as of 01/06/2023  Medication Sig   cetirizine (ZYRTEC) 10 MG tablet Take 10 mg by mouth daily.   montelukast (SINGULAIR) 10 MG tablet Take 1 tablet (10 mg total) by mouth at bedtime.   omeprazole (PRILOSEC) 40 MG capsule TAKE ONE CAPSULE BY MOUTH ONCE DAILY, 30 MINUTES PRIOR TO EVENING MEAL.   [DISCONTINUED] albuterol (VENTOLIN HFA) 108 (90 Base) MCG/ACT inhaler INHALE 2 PUFFS INTO THE LUNGS EVERY 6 HOURS AS NEEDED FOR WHEEZING (COUGH).   [DISCONTINUED] budesonide-formoterol (SYMBICORT) 160-4.5 MCG/ACT inhaler Take 2 puffs first thing in am and then another 2 puffs about 12 hours later.   [DISCONTINUED] cefPROZIL (CEFZIL) 500 MG tablet Take 1 tablet (500 mg total) by mouth 2 (two) times daily.   [DISCONTINUED] EPINEPHrine 0.3 mg/0.3 mL IJ SOAJ injection See admin instructions.    No facility-administered encounter medications on file as of 01/06/2023.    Past Medical History:  Diagnosis Date   Kidney stones    Sinusitis, chronic     Past Surgical History:  Procedure Laterality Date   ETHMOIDECTOMY Bilateral 04/19/2017   Procedure: ETHMOIDECTOMY RIGHT ANTERIOR / LEFT TOTAL ETHMOIDECTOMY;  Surgeon: Geanie Logan, MD;  Location: [facility];  Service: ENT;  Laterality: Bilateral;   EXCISION METACARPAL MASS Right 08/05/2015   Procedure: revision right index finger amputation with closure;  Surgeon: Kennedy Bucker, MD;  Location: [facility];  Service: Orthopedics;  Laterality: Right;   FRONTAL SINUS EXPLORATION Bilateral 04/19/2017   Procedure: FRONTAL SINUS EXPLORATION;  Surgeon: Geanie Logan, MD;  Location: [facility];  Service: ENT;  Laterality: Bilateral;   IMAGE GUIDED SINUS SURGERY N/A 04/19/2017   Procedure: IMAGE GUIDED SINUS SURGERY;  Surgeon: Geanie Logan, MD;  Location: [facility];  Service: ENT;  Laterality: N/A;    LEFT SPHENOIDECTOMY NOT PERFORMED POLYPS WERE BLOCKING THE OPENING AND WHEN POLYPS WERE REMOVED THE SINUS CAVITY WAS OPEN   NEED DISK GAVE DISK TO CECE 6-6- KP   MAXILLARY ANTROSTOMY Bilateral 04/19/2017   Procedure: MAXILLARY ANTROSTOMY;  Surgeon: Geanie Logan, MD;  Location: [facility];  Service: ENT;  Laterality: Bilateral;   POLYPECTOMY Left 04/19/2017   Procedure: POLYPECTOMY NASAL ENDOSCOPY LEFT;  Surgeon: Geanie Logan, MD;  Location:  [facility];  Service: ENT;  Laterality: Left;   SEPTOPLASTY N/A 05/19/2017   Procedure: POST OP SINUS BLEED;  Surgeon: Bud Face, MD;  Location: [facility];  Service: ENT;  Laterality: N/A;   TURBINATE REDUCTION Bilateral 04/19/2017   Procedure: Frederik Schmidt COBLATION;  Surgeon: Geanie Logan, MD;  Location: [facility];  Service: ENT;  Laterality: Bilateral;    Family History  Problem Relation Age of Onset   Stroke Father    Cancer Father      Social History   Socioeconomic History   Marital status: Married    Spouse name: Not on file   Number of children: Not on file   Years of education: Not on file   Highest education level: Not on file  Occupational History   Not on file  Tobacco Use   Smoking status: Never   Smokeless tobacco: Never  Vaping Use   Vaping Use: Never used  Substance and Sexual Activity   Alcohol use: Not Currently    Comment: occ., 1x/mo   Drug use: No   Sexual activity: Yes    Partners: Female  Other Topics Concern   Not on file  Social History Narrative   Not on file   Social Determinants of Health   Financial Resource Strain: Not on file  Food Insecurity: Not on file  Transportation Needs: Not on file  Physical Activity: Not on file  Stress: Not on file  Social Connections: Not on file  Intimate Partner Violence: Not on file    Review of Systems  Constitutional:  Positive for malaise/fatigue.  HENT: Negative.    Eyes: Negative.   Respiratory:  Negative for cough and shortness of breath.   Cardiovascular:  Negative for chest pain and leg swelling.  Gastrointestinal: Negative.   Genitourinary: Negative.   Musculoskeletal: Negative.   Skin: Negative.   Neurological: Negative.   Psychiatric/Behavioral: Negative.          Objective    BP 114/74   Pulse 99   Temp 98.4 F (36.9 C)   Ht 6' (1.829 m)   Wt 237 lb 6.4 oz (107.7 kg)   SpO2 96%   BMI 32.20 kg/m   Physical Exam Constitutional:      Appearance: Normal appearance. He is normal weight.  HENT:     Head: Normocephalic.     Right Ear: Tympanic membrane normal.     Left Ear: Tympanic membrane normal.     Mouth/Throat:     Mouth: Mucous membranes are moist.  Eyes:     Extraocular Movements: Extraocular movements intact.     Conjunctiva/sclera: Conjunctivae normal.     Pupils: Pupils are equal, round, and reactive to light.  Neck:     Thyroid: No thyroid mass or thyroid tenderness.  Cardiovascular:      Rate and Rhythm: Normal rate and regular rhythm.     Pulses: Normal pulses.     Heart sounds: Normal heart sounds. No murmur heard. Pulmonary:     Effort: Pulmonary effort is normal.     Breath sounds: Normal breath sounds.  Abdominal:     General: Bowel sounds are normal.     Palpations: Abdomen is soft. There is no mass.     Tenderness: There is no abdominal tenderness. There is no rebound.  Musculoskeletal:        General: No swelling.     Cervical back: Neck supple. No tenderness.     Right lower leg: No edema.  Left lower leg: No edema.  Skin:    Findings: No bruising, erythema or rash.  Neurological:     General: No focal deficit present.     Mental Status: He is alert and oriented to person, place, and time. Mental status is at baseline.  Psychiatric:        Mood and Affect: Mood normal.        Behavior: Behavior normal.        Thought Content: Thought content normal.        Judgment: Judgment normal.         Assessment & Plan:  Other fatigue Assessment & Plan: Will check labs today.  Advised patient to follow-up healthy diet and regular exercise schedule.  Orders: -     Lipid panel -     PSA -     TSH -     Comprehensive metabolic panel -     CBC with Differential/Platelet -     Vitamin B12 -     VITAMIN D 25 Hydroxy (Vit-D Deficiency, Fractures)  Cough variant asthma Assessment & Plan: Stable at present.   Seasonal allergies Assessment & Plan: Stable at present. Continue montelukast and antihistamine as needed.     Return if symptoms worsen or fail to improve.   Kara Diesharanpreet  Sanaa Zilberman, NP

## 2023-01-06 NOTE — Assessment & Plan Note (Signed)
Stable at present. Continue montelukast and antihistamine as needed.

## 2023-01-07 LAB — COMPREHENSIVE METABOLIC PANEL
ALT: 20 U/L (ref 0–53)
AST: 21 U/L (ref 0–37)
Albumin: 4.2 g/dL (ref 3.5–5.2)
Alkaline Phosphatase: 69 U/L (ref 39–117)
BUN: 15 mg/dL (ref 6–23)
CO2: 24 mEq/L (ref 19–32)
Calcium: 8.6 mg/dL (ref 8.4–10.5)
Chloride: 106 mEq/L (ref 96–112)
Creatinine, Ser: 1.01 mg/dL (ref 0.40–1.50)
GFR: 83.99 mL/min (ref >60.00)
Glucose, Bld: 109 mg/dL — ABNORMAL HIGH (ref 70–99)
Potassium: 3.8 mEq/L (ref 3.5–5.1)
Sodium: 142 mEq/L (ref 135–145)
Total Bilirubin: 0.7 mg/dL (ref 0.2–1.2)
Total Protein: 6.4 g/dL (ref 6.0–8.3)

## 2023-01-07 LAB — CBC WITH DIFFERENTIAL/PLATELET
Basophils Absolute: 0 10*3/uL (ref 0.0–0.1)
Basophils Relative: 0.4 % (ref 0.0–3.0)
Eosinophils Absolute: 0.2 10*3/uL (ref 0.0–0.7)
Eosinophils Relative: 1.9 % (ref 0.0–5.0)
HCT: 48 % (ref 39.0–52.0)
Hemoglobin: 16 g/dL (ref 13.0–17.0)
Lymphocytes Relative: 6.7 % — ABNORMAL LOW (ref 12.0–46.0)
Lymphs Abs: 0.5 10*3/uL — ABNORMAL LOW (ref 0.7–4.0)
MCHC: 33.3 g/dL (ref 30.0–36.0)
MCV: 89.4 fl (ref 78.0–100.0)
Monocytes Absolute: 0.2 10*3/uL (ref 0.1–1.0)
Monocytes Relative: 2.6 % — ABNORMAL LOW (ref 3.0–12.0)
Neutro Abs: 7.2 10*3/uL (ref 1.4–7.7)
Neutrophils Relative %: 88.4 % — ABNORMAL HIGH (ref 43.0–77.0)
Platelets: 142 10*3/uL — ABNORMAL LOW (ref 150.0–400.0)
RBC: 5.37 Mil/uL (ref 4.22–5.81)
RDW: 13.9 % (ref 11.5–15.5)
WBC: 8.1 10*3/uL (ref 4.0–10.5)

## 2023-01-07 LAB — LIPID PANEL
Cholesterol: 146 mg/dL (ref 0–200)
HDL: 45.4 mg/dL
LDL Cholesterol: 82 mg/dL (ref 0–99)
NonHDL: 100.24
Total CHOL/HDL Ratio: 3
Triglycerides: 91 mg/dL (ref 0.0–149.0)
VLDL: 18.2 mg/dL (ref 0.0–40.0)

## 2023-01-07 LAB — VITAMIN D 25 HYDROXY (VIT D DEFICIENCY, FRACTURES): VITD: 25.93 ng/mL — ABNORMAL LOW (ref 30.00–100.00)

## 2023-01-07 LAB — PSA: PSA: 0.4 ng/mL (ref 0.10–4.00)

## 2023-01-07 LAB — VITAMIN B12: Vitamin B-12: 442 pg/mL (ref 211–911)

## 2023-01-07 LAB — TSH: TSH: 0.36 u[IU]/mL (ref 0.35–5.50)

## 2023-01-11 ENCOUNTER — Other Ambulatory Visit: Payer: Self-pay | Admitting: Nurse Practitioner

## 2023-01-11 DIAGNOSIS — R7989 Other specified abnormal findings of blood chemistry: Secondary | ICD-10-CM

## 2023-01-11 NOTE — Progress Notes (Signed)
Liver and kidney functions looks good. We will repeat CBCs with differential in 1 weeks due to elevated neutrophils and low lymphocytes. Vit D on lower side take OTC Vit D supplement 2000 IU daily for 3 months.

## 2023-01-12 ENCOUNTER — Telehealth: Payer: Self-pay

## 2023-01-12 NOTE — Telephone Encounter (Signed)
F/U   Rtn call back to the CMA on test results.

## 2023-01-12 NOTE — Telephone Encounter (Signed)
LVM to call back to go over results 

## 2023-01-13 NOTE — Telephone Encounter (Signed)
Spoke to Patient regarding lab results. Patient agreed to pick up OTC vitamin D and take 2000 IU daily for 3 months.

## 2023-01-20 ENCOUNTER — Other Ambulatory Visit (INDEPENDENT_AMBULATORY_CARE_PROVIDER_SITE_OTHER): Payer: BC Managed Care – PPO

## 2023-01-20 DIAGNOSIS — R7989 Other specified abnormal findings of blood chemistry: Secondary | ICD-10-CM

## 2023-01-20 LAB — CBC WITH DIFFERENTIAL/PLATELET
Basophils Absolute: 0.1 10*3/uL (ref 0.0–0.1)
Basophils Relative: 1.4 % (ref 0.0–3.0)
Eosinophils Absolute: 0.3 10*3/uL (ref 0.0–0.7)
Eosinophils Relative: 6.5 % — ABNORMAL HIGH (ref 0.0–5.0)
HCT: 47 % (ref 39.0–52.0)
Hemoglobin: 15.6 g/dL (ref 13.0–17.0)
Lymphocytes Relative: 37.6 % (ref 12.0–46.0)
Lymphs Abs: 1.9 10*3/uL (ref 0.7–4.0)
MCHC: 33.3 g/dL (ref 30.0–36.0)
MCV: 89.9 fl (ref 78.0–100.0)
Monocytes Absolute: 0.7 10*3/uL (ref 0.1–1.0)
Monocytes Relative: 12.8 % — ABNORMAL HIGH (ref 3.0–12.0)
Neutro Abs: 2.2 10*3/uL (ref 1.4–7.7)
Neutrophils Relative %: 41.7 % — ABNORMAL LOW (ref 43.0–77.0)
Platelets: 184 10*3/uL (ref 150.0–400.0)
RBC: 5.23 Mil/uL (ref 4.22–5.81)
RDW: 13.9 % (ref 11.5–15.5)
WBC: 5.2 10*3/uL (ref 4.0–10.5)

## 2023-02-11 ENCOUNTER — Telehealth: Payer: BC Managed Care – PPO | Admitting: Family Medicine

## 2023-02-11 DIAGNOSIS — B9689 Other specified bacterial agents as the cause of diseases classified elsewhere: Secondary | ICD-10-CM | POA: Diagnosis not present

## 2023-02-11 DIAGNOSIS — J019 Acute sinusitis, unspecified: Secondary | ICD-10-CM

## 2023-02-11 MED ORDER — DOXYCYCLINE HYCLATE 100 MG PO TABS: 100.0000 mg | ORAL_TABLET | Freq: Two times a day (BID) | ORAL | 0 refills | Status: AC

## 2023-02-11 NOTE — Patient Instructions (Signed)

## 2023-02-11 NOTE — Progress Notes (Signed)
Virtual Visit Consent   Jackson Matthews, you are scheduled for a virtual visit with a [facility] provider today. Just as with appointments in the office, your consent must be obtained to participate. Your consent will be active for this visit and any virtual visit you may have with one of our providers in the next 365 days. If you have a MyChart account, a copy of this consent can be sent to you electronically.  As this is a virtual visit, video technology does not allow for your provider to perform a traditional examination. This may limit your provider's ability to fully assess your condition. If your provider identifies any concerns that need to be evaluated in person or the need to arrange testing (such as labs, EKG, etc.), we will make arrangements to do so. Although advances in technology are sophisticated, we cannot ensure that it will always work on either your end or our end. If the connection with a video visit is poor, the visit may have to be switched to a telephone visit. With either a video or telephone visit, we are not always able to ensure that we have a secure connection.  By engaging in this virtual visit, you consent to the provision of healthcare and authorize for your insurance to be billed (if applicable) for the services provided during this visit. Depending on your insurance coverage, you may receive a charge related to this service.  I need to obtain your verbal consent now. Are you willing to proceed with your visit today? Jackson Matthews has provided verbal consent on 02/11/2023 for a virtual visit (video or telephone). Georgana Curio, FNP  Date: 02/11/2023 4:12 PM  Virtual Visit via Video Note   I, Georgana Curio, connected with  Jackson Matthews  ([id number], [birth year]) on 02/11/23 at  4:15 PM EDT by a video-enabled telemedicine application and verified that I am speaking with the correct person using two identifiers.  Location: Patient: Virtual Visit Location Patient:  Home Provider: Virtual Visit Location Provider: Home Office   I discussed the limitations of evaluation and management by telemedicine and the availability of in person appointments. The patient expressed understanding and agreed to proceed.    History of Present Illness: Jackson Matthews is a 57 y.o. who identifies as a male who was assigned male at birth, and is being seen today for sinus pressure and pain with headache and green post nasal drainage for 10-11 days worsening. Marland Kitchen  HPI: HPI  Problems:  Patient Active Problem List   Diagnosis Date Noted   Other fatigue 01/06/2023   Seasonal allergies 01/06/2023   Cough variant asthma 08/26/2020   History of COVID-19 08/26/2020    Allergies: No Known Allergies Medications:  Current Outpatient Medications:    cetirizine (ZYRTEC) 10 MG tablet, Take 10 mg by mouth daily., Disp: , Rfl:    montelukast (SINGULAIR) 10 MG tablet, Take 1 tablet (10 mg total) by mouth at bedtime., Disp: 30 tablet, Rfl: 0   omeprazole (PRILOSEC) 40 MG capsule, TAKE ONE CAPSULE BY MOUTH ONCE DAILY, 30 MINUTES PRIOR TO EVENING MEAL., Disp: , Rfl:   Observations/Objective: Patient is well-developed, well-nourished in no acute distress.  Resting comfortably  at home.  Head is normocephalic, atraumatic.  No labored breathing.  Speech is clear and coherent with logical content.  Patient is alert and oriented at baseline.    Assessment and Plan: 1. Acute bacterial sinusitis  Increase fluids, UC if sx worsen  Follow Up Instructions: I discussed  the assessment and treatment plan with the patient. The patient was provided an opportunity to ask questions and all were answered. The patient agreed with the plan and demonstrated an understanding of the instructions.  A copy of instructions were sent to the patient via MyChart unless otherwise noted below.     The patient was advised to call back or seek an in-person evaluation if the symptoms worsen or if the condition  fails to improve as anticipated.  Time:  I spent 10 minutes with the patient via telehealth technology discussing the above problems/concerns.    Georgana Curio, FNP

## 2023-02-18 DIAGNOSIS — J018 Other acute sinusitis: Secondary | ICD-10-CM | POA: Diagnosis not present

## 2023-03-14 DIAGNOSIS — D225 Melanocytic nevi of trunk: Secondary | ICD-10-CM | POA: Diagnosis not present

## 2023-03-14 DIAGNOSIS — D2239 Melanocytic nevi of other parts of face: Secondary | ICD-10-CM | POA: Diagnosis not present

## 2023-03-14 DIAGNOSIS — D2261 Melanocytic nevi of right upper limb, including shoulder: Secondary | ICD-10-CM | POA: Diagnosis not present

## 2023-03-14 DIAGNOSIS — M1711 Unilateral primary osteoarthritis, right knee: Secondary | ICD-10-CM | POA: Diagnosis not present

## 2023-03-14 DIAGNOSIS — D485 Neoplasm of uncertain behavior of skin: Secondary | ICD-10-CM | POA: Diagnosis not present

## 2023-03-14 DIAGNOSIS — C44612 Basal cell carcinoma of skin of right upper limb, including shoulder: Secondary | ICD-10-CM | POA: Diagnosis not present

## 2023-03-14 DIAGNOSIS — D2262 Melanocytic nevi of left upper limb, including shoulder: Secondary | ICD-10-CM | POA: Diagnosis not present

## 2023-03-14 DIAGNOSIS — L570 Actinic keratosis: Secondary | ICD-10-CM | POA: Diagnosis not present

## 2023-03-14 DIAGNOSIS — C44619 Basal cell carcinoma of skin of left upper limb, including shoulder: Secondary | ICD-10-CM | POA: Diagnosis not present

## 2023-03-25 DIAGNOSIS — J310 Chronic rhinitis: Secondary | ICD-10-CM | POA: Diagnosis not present

## 2023-03-25 DIAGNOSIS — J301 Allergic rhinitis due to pollen: Secondary | ICD-10-CM | POA: Diagnosis not present

## 2023-03-25 DIAGNOSIS — C44619 Basal cell carcinoma of skin of left upper limb, including shoulder: Secondary | ICD-10-CM | POA: Diagnosis not present

## 2023-03-25 DIAGNOSIS — C44612 Basal cell carcinoma of skin of right upper limb, including shoulder: Secondary | ICD-10-CM | POA: Diagnosis not present

## 2023-06-23 DIAGNOSIS — A499 Bacterial infection, unspecified: Secondary | ICD-10-CM | POA: Diagnosis not present

## 2023-07-11 DIAGNOSIS — D225 Melanocytic nevi of trunk: Secondary | ICD-10-CM | POA: Diagnosis not present

## 2023-07-11 DIAGNOSIS — Z85828 Personal history of other malignant neoplasm of skin: Secondary | ICD-10-CM | POA: Diagnosis not present

## 2023-07-11 DIAGNOSIS — D2261 Melanocytic nevi of right upper limb, including shoulder: Secondary | ICD-10-CM | POA: Diagnosis not present

## 2023-07-11 DIAGNOSIS — D485 Neoplasm of uncertain behavior of skin: Secondary | ICD-10-CM | POA: Diagnosis not present

## 2023-07-11 DIAGNOSIS — L570 Actinic keratosis: Secondary | ICD-10-CM | POA: Diagnosis not present

## 2023-07-11 DIAGNOSIS — D045 Carcinoma in situ of skin of trunk: Secondary | ICD-10-CM | POA: Diagnosis not present

## 2023-07-11 DIAGNOSIS — D2262 Melanocytic nevi of left upper limb, including shoulder: Secondary | ICD-10-CM | POA: Diagnosis not present

## 2023-07-11 DIAGNOSIS — C44519 Basal cell carcinoma of skin of other part of trunk: Secondary | ICD-10-CM | POA: Diagnosis not present

## 2023-11-01 DIAGNOSIS — J019 Acute sinusitis, unspecified: Secondary | ICD-10-CM | POA: Diagnosis not present

## 2023-11-04 ENCOUNTER — Ambulatory Visit: Payer: BC Managed Care – PPO | Admitting: Nurse Practitioner

## 2023-11-04 ENCOUNTER — Encounter: Payer: Self-pay | Admitting: Nurse Practitioner

## 2023-11-04 VITALS — BP 122/76 | HR 79 | Temp 98.1°F | Ht 72.0 in | Wt 236.6 lb

## 2023-11-04 DIAGNOSIS — Z0000 Encounter for general adult medical examination without abnormal findings: Secondary | ICD-10-CM

## 2023-11-04 DIAGNOSIS — J0140 Acute pansinusitis, unspecified: Secondary | ICD-10-CM | POA: Diagnosis not present

## 2023-11-04 MED ORDER — PREDNISONE 20 MG PO TABS: 20.0000 mg | ORAL_TABLET | Freq: Every day | ORAL | 0 refills | Status: AC

## 2023-11-04 MED ORDER — AMOXICILLIN-POT CLAVULANATE 875-125 MG PO TABS
1.0000 | ORAL_TABLET | Freq: Two times a day (BID) | ORAL | 0 refills | Status: DC
Start: 2023-11-04 — End: 2023-12-02

## 2023-11-04 NOTE — Progress Notes (Signed)
 Established Patient Office Visit  Subjective:  Patient ID: Jackson Matthews, male    DOB: 12/04/1965  Age: 58 y.o. MRN: [id number]  CC:  Chief Complaint  Patient presents with   Sinusitis    Sinus infection x 6 weeks  Tele visit  x 3 weeks ago diagnosed with sinus infection gave Amoxicillin  Finished Amoxicillin  and still has sinus infection     HPI  [name] presents for sinus symptoms.  Sinusitis This is a recurrent problem. The current episode started in the past 7 days. There has been no fever. Associated symptoms include congestion, coughing, headaches and sinus pressure. Pertinent negatives include no sore throat. Past treatments include antibiotics. The treatment provided no relief.  Patient reports that had televisit 3 weeks ago and was started on amoxicillin  but symptoms did not improved.   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;  Surgeon: Ozell Flake, MD;  Location: [facility];  Service: Orthopedics;  Laterality: Right;   FRONTAL SINUS EXPLORATION Bilateral 04/19/2017   Procedure: FRONTAL SINUS EXPLORATION;  Surgeon: Blair Mt, MD;  Location: [facility];  Service: ENT;  Laterality: Bilateral;   IMAGE GUIDED SINUS SURGERY N/A 04/19/2017   Procedure: IMAGE GUIDED SINUS SURGERY;  Surgeon: Blair Mt, MD;  Location: [facility];  Service: ENT;  Laterality: N/A;    LEFT SPHENOIDECTOMY NOT PERFORMED POLYPS WERE BLOCKING THE OPENING AND WHEN POLYPS WERE REMOVED THE SINUS CAVITY WAS OPEN   NEED DISK GAVE DISK TO CECE 6-6- KP   MAXILLARY  ANTROSTOMY Bilateral 04/19/2017   Procedure: MAXILLARY ANTROSTOMY;  Surgeon: Blair Mt, MD;  Location: [facility];  Service: ENT;  Laterality: Bilateral;   POLYPECTOMY Left 04/19/2017   Procedure: POLYPECTOMY NASAL ENDOSCOPY LEFT;  Surgeon: Blair Mt, MD;  Location: [facility];  Service: ENT;  Laterality: Left;   SEPTOPLASTY N/A 05/19/2017   Procedure: POST OP SINUS BLEED;  Surgeon: Milissa Hamming, MD;  Location: [facility];  Service: ENT;  Laterality: N/A;   TURBINATE REDUCTION Bilateral 04/19/2017   Procedure: MONTA COBLATION;  Surgeon: Blair Mt, MD;  Location: [facility];  Service: ENT;  Laterality: Bilateral;    Family History  Problem Relation Age of Onset   Stroke Father    Cancer Father     Social History   Socioeconomic History   Marital status: Married    Spouse name: Not on file   Number of children: Not on file   Years of education: Not on file   Highest education level: Not on file  Occupational History   Not on file  Tobacco Use   Smoking status: Never   Smokeless tobacco: Never  Vaping Use   Vaping status: Never Used  Substance and Sexual Activity   Alcohol use: Not Currently    Comment: occ., 1x/mo   Drug use: No   Sexual activity: Yes    Partners: Female  Other Topics Concern   Not on file  Social History Narrative   Not on file   Social Drivers of Health   Financial Resource Strain: Not on file  Food Insecurity:  Not on file  Transportation Needs: Not on file  Physical Activity: Not on file  Stress: Not on file  Social Connections: Not on file  Intimate Partner Violence: Not on file     Outpatient Medications Prior to Visit  Medication Sig Dispense Refill   cetirizine (ZYRTEC) 10 MG tablet Take 10 mg by mouth daily.     omeprazole  (PRILOSEC) 40 MG capsule TAKE ONE CAPSULE BY MOUTH ONCE DAILY, 30 MINUTES PRIOR TO EVENING MEAL.     montelukast  (SINGULAIR ) 10 MG tablet Take 1 tablet (10 mg total) by mouth  at bedtime. (Patient not taking: Reported on 11/04/2023) 30 tablet 0   No facility-administered medications prior to visit.    No Known Allergies  ROS Review of Systems  HENT:  Positive for congestion and sinus pressure. Negative for sore throat.   Respiratory:  Positive for cough.   Neurological:  Positive for headaches.   Negative unless indicated in HPI.    Objective:    Physical Exam Constitutional:      Appearance: Normal appearance.  HENT:     Right Ear: Tympanic membrane normal. Tympanic membrane is not erythematous.     Left Ear: Tympanic membrane normal. Tympanic membrane is not erythematous.     Nose:     Right Turbinates: Not enlarged.     Left Turbinates: Not enlarged.     Right Sinus: Maxillary sinus tenderness and frontal sinus tenderness present.     Left Sinus: Maxillary sinus tenderness and frontal sinus tenderness present.     Mouth/Throat:     Mouth: Mucous membranes are moist.     Pharynx: No pharyngeal swelling, oropharyngeal exudate or posterior oropharyngeal erythema.     Tonsils: No tonsillar exudate.  Cardiovascular:     Rate and Rhythm: Normal rate and regular rhythm.  Pulmonary:     Effort: Pulmonary effort is normal.     Breath sounds: Normal breath sounds. No stridor. No wheezing.  Neurological:     General: No focal deficit present.     Mental Status: He is alert and oriented to person, place, and time. Mental status is at baseline.  Psychiatric:        Mood and Affect: Mood normal.        Behavior: Behavior normal.        Thought Content: Thought content normal.        Judgment: Judgment normal.     BP 122/76   Pulse 79   Temp 98.1 F (36.7 C)   Ht 6' (1.829 m)   Wt 236 lb 9.6 oz (107.3 kg)   SpO2 95%   BMI 32.09 kg/m  Wt Readings from Last 3 Encounters:  11/04/23 236 lb 9.6 oz (107.3 kg)  01/06/23 237 lb 6.4 oz (107.7 kg)  07/06/21 228 lb 1 oz (103.4 kg)     Health Maintenance  Topic Date Due   Pneumococcal Vaccine 8-69  Years old (1 of 2 - PCV) Never done   HIV Screening  Never done   Hepatitis C Screening  Never done   DTaP/Tdap/Td (1 - Tdap) Never done   Colonoscopy  Never done   Zoster Vaccines- Shingrix (1 of 2) Never done   COVID-19 Vaccine (1 - 2024-25 season) 11/20/2023 (Originally 05/29/2023)   INFLUENZA VACCINE  12/26/2023 (Originally 04/28/2023)   HPV VACCINES  Aged Out    There are no preventive care reminders to display for this patient.  Lab Results  Component Value Date   TSH 0.36  01/06/2023   Lab Results  Component Value Date   WBC 5.2 01/20/2023   HGB 15.6 01/20/2023   HCT 47.0 01/20/2023   MCV 89.9 01/20/2023   PLT 184.0 01/20/2023   Lab Results  Component Value Date   NA 142 01/06/2023   K 3.8 01/06/2023   CO2 24 01/06/2023   GLUCOSE 109 (H) 01/06/2023   BUN 15 01/06/2023   CREATININE 1.01 01/06/2023   BILITOT 0.7 01/06/2023   ALKPHOS 69 01/06/2023   AST 21 01/06/2023   ALT 20 01/06/2023   PROT 6.4 01/06/2023   ALBUMIN 4.2 01/06/2023   CALCIUM 8.6 01/06/2023   ANIONGAP 11 07/21/2020   GFR 83.99 01/06/2023   Lab Results  Component Value Date   CHOL 146 01/06/2023   Lab Results  Component Value Date   HDL 45.40 01/06/2023   Lab Results  Component Value Date   LDLCALC 82 01/06/2023   Lab Results  Component Value Date   TRIG 91.0 01/06/2023   Lab Results  Component Value Date   CHOLHDL 3 01/06/2023   No results found for: HGBA1C    Assessment & Plan:  Subacute pansinusitis Assessment & Plan: Pt is afebrile, non toxic appearing, without respiratoty distress.  Has ongoing sinus pain from 3 weeks. Will treat with Augmentin  and prednisone . Advised to take plain Mucinex and OTC antihistamine for PND. Incresae fluid intake and rest.   Orders: -     Amoxicillin -Pot Clavulanate; Take 1 tablet by mouth 2 (two) times daily.  Dispense: 20 tablet; Refill: 0 -     predniSONE ; Take 1 tablet (20 mg total) by mouth daily with breakfast for 5 days.  Dispense: 5  tablet; Refill: 0  Annual physical exam -     CBC with Differential/Platelet; Future -     Comprehensive metabolic panel; Future -     Lipid panel; Future -     TSH; Future -     VITAMIN D  25 Hydroxy (Vit-D Deficiency, Fractures); Future    Follow-up: Return in about 1 month (around 12/02/2023) for physical, follow up with fasting lab 2 days prior.   Richie Bonanno, NP

## 2023-11-13 DIAGNOSIS — J0140 Acute pansinusitis, unspecified: Secondary | ICD-10-CM | POA: Insufficient documentation

## 2023-11-13 NOTE — Assessment & Plan Note (Signed)
 Pt is afebrile, non toxic appearing, without respiratoty distress.  Has ongoing sinus pain from 3 weeks. Will treat with Augmentin and prednisone. Advised to take plain Mucinex and OTC antihistamine for PND. Incresae fluid intake and rest.

## 2023-11-30 ENCOUNTER — Other Ambulatory Visit: Payer: BC Managed Care – PPO

## 2023-12-02 ENCOUNTER — Ambulatory Visit: Payer: BC Managed Care – PPO | Admitting: Nurse Practitioner

## 2023-12-02 ENCOUNTER — Encounter: Payer: Self-pay | Admitting: Nurse Practitioner

## 2023-12-02 VITALS — BP 124/84 | HR 66 | Temp 97.9°F | Ht 72.0 in | Wt 239.6 lb

## 2023-12-02 DIAGNOSIS — Z0000 Encounter for general adult medical examination without abnormal findings: Secondary | ICD-10-CM

## 2023-12-02 DIAGNOSIS — Z1211 Encounter for screening for malignant neoplasm of colon: Secondary | ICD-10-CM

## 2023-12-02 LAB — CBC WITH DIFFERENTIAL/PLATELET
Basophils Absolute: 0 10*3/uL (ref 0.0–0.1)
Basophils Relative: 0.8 % (ref 0.0–3.0)
Eosinophils Absolute: 0.3 10*3/uL (ref 0.0–0.7)
Eosinophils Relative: 6.5 % — ABNORMAL HIGH (ref 0.0–5.0)
HCT: 49.9 % (ref 39.0–52.0)
Hemoglobin: 16.5 g/dL (ref 13.0–17.0)
Lymphocytes Relative: 42.3 % (ref 12.0–46.0)
Lymphs Abs: 1.8 10*3/uL (ref 0.7–4.0)
MCHC: 33 g/dL (ref 30.0–36.0)
MCV: 90.5 fl (ref 78.0–100.0)
Monocytes Absolute: 0.5 10*3/uL (ref 0.1–1.0)
Monocytes Relative: 11 % (ref 3.0–12.0)
Neutro Abs: 1.7 10*3/uL (ref 1.4–7.7)
Neutrophils Relative %: 39.4 % — ABNORMAL LOW (ref 43.0–77.0)
Platelets: 158 10*3/uL (ref 150.0–400.0)
RBC: 5.52 Mil/uL (ref 4.22–5.81)
RDW: 14.3 % (ref 11.5–15.5)
WBC: 4.4 10*3/uL (ref 4.0–10.5)

## 2023-12-02 LAB — COMPREHENSIVE METABOLIC PANEL
ALT: 21 U/L (ref 0–53)
AST: 19 U/L (ref 0–37)
Albumin: 4.5 g/dL (ref 3.5–5.2)
Alkaline Phosphatase: 66 U/L (ref 39–117)
BUN: 16 mg/dL (ref 6–23)
CO2: 25 meq/L (ref 19–32)
Calcium: 9.3 mg/dL (ref 8.4–10.5)
Chloride: 107 meq/L (ref 96–112)
Creatinine, Ser: 0.97 mg/dL (ref 0.40–1.50)
GFR: 87.6 mL/min (ref >60.00)
Glucose, Bld: 86 mg/dL (ref 70–99)
Potassium: 4.4 meq/L (ref 3.5–5.1)
Sodium: 141 meq/L (ref 135–145)
Total Bilirubin: 0.8 mg/dL (ref 0.2–1.2)
Total Protein: 6.8 g/dL (ref 6.0–8.3)

## 2023-12-02 LAB — VITAMIN D 25 HYDROXY (VIT D DEFICIENCY, FRACTURES): VITD: 23.71 ng/mL — ABNORMAL LOW (ref 30.00–100.00)

## 2023-12-02 LAB — LIPID PANEL
Cholesterol: 177 mg/dL (ref 0–200)
HDL: 54.6 mg/dL (ref >39.00)
LDL Cholesterol: 111 mg/dL — ABNORMAL HIGH (ref 0–99)
NonHDL: 122.09
Total CHOL/HDL Ratio: 3
Triglycerides: 54 mg/dL (ref 0.0–149.0)
VLDL: 10.8 mg/dL (ref 0.0–40.0)

## 2023-12-02 LAB — TSH: TSH: 0.71 u[IU]/mL (ref 0.35–5.50)

## 2023-12-02 LAB — PSA: PSA: 0.37 ng/mL (ref 0.10–4.00)

## 2023-12-02 MED ORDER — OMEPRAZOLE 40 MG PO CPDR: 40.0000 mg | DELAYED_RELEASE_CAPSULE | Freq: Every day | ORAL | 3 refills | Status: AC

## 2023-12-02 NOTE — Progress Notes (Signed)
 Complete physical exam  Patient: Jackson Matthews   DOB: Jan 28, 1966   58 y.o. Male  MRN: [id number]  Subjective:    Chief Complaint  Patient presents with   Annual Exam    Patient presents to the clinic for his annual physical exam.  Flu: decline Tetanus: jan, 2020 COVID: decline Dentist: every 6 months Eye examination: Due Exercise: 4 times a week treadmill and weights for 1 hr.  Diet: Patient does eat meat(mostly baked chicken and and twice a week red meat). Patient consumes fruits and veggies. Patient eat some fried food. Patient drinks water, diet soda once a week.   Discussed lab results.  Most recent fall risk assessment:    12/02/2023   10:10 AM  Fall Risk   Falls in the past year? 0  Number falls in past yr: 0  Injury with Fall? 0  Risk for fall due to : No Fall Risks  Follow up Falls evaluation completed     Most recent depression screenings:    12/02/2023   10:11 AM 11/04/2023   11:42 AM  PHQ 2/9 Scores  PHQ - 2 Score 0 0  PHQ- 9 Score 0 0        Patient Care Team: Kara Dies, NP as PCP - General (Nurse Practitioner)   Outpatient Medications Prior to Visit  Medication Sig   cetirizine (ZYRTEC) 10 MG tablet Take 10 mg by mouth daily.   montelukast (SINGULAIR) 10 MG tablet Take 1 tablet (10 mg total) by mouth at bedtime.   [DISCONTINUED] omeprazole (PRILOSEC) 40 MG capsule TAKE ONE CAPSULE BY MOUTH ONCE DAILY, 30 MINUTES PRIOR TO EVENING MEAL.   [DISCONTINUED] amoxicillin-clavulanate (AUGMENTIN) 875-125 MG tablet Take 1 tablet by mouth 2 (two) times daily.   No facility-administered medications prior to visit.    ROS Negative unless indicated in HPI.     Objective:     BP 124/84   Pulse 66   Temp 97.9 F (36.6 C)   Ht 6' (1.829 m)   Wt 239 lb 9.6 oz (108.7 kg)   SpO2 96%   BMI 32.50 kg/m    Physical Exam Constitutional:      Appearance: Normal appearance. He is normal weight.  HENT:     Head: Normocephalic.     Right  Ear: Tympanic membrane normal.     Left Ear: Tympanic membrane normal.     Mouth/Throat:     Mouth: Mucous membranes are moist.  Eyes:     Extraocular Movements: Extraocular movements intact.     Conjunctiva/sclera: Conjunctivae normal.     Pupils: Pupils are equal, round, and reactive to light.  Neck:     Thyroid: No thyroid mass or thyroid tenderness.  Cardiovascular:     Rate and Rhythm: Normal rate and regular rhythm.     Pulses: Normal pulses.     Heart sounds: Normal heart sounds. No murmur heard. Pulmonary:     Effort: Pulmonary effort is normal.     Breath sounds: Normal breath sounds.  Abdominal:     General: Bowel sounds are normal.     Palpations: Abdomen is soft. There is no mass.     Tenderness: There is no abdominal tenderness. There is no rebound.  Musculoskeletal:        General: No swelling.     Cervical back: Neck supple. No tenderness.     Right lower leg: No edema.     Left lower leg: No edema.  Skin:  Findings: No bruising, erythema or rash.  Neurological:     General: No focal deficit present.     Mental Status: He is alert and oriented to person, place, and time. Mental status is at baseline.  Psychiatric:        Mood and Affect: Mood normal.        Behavior: Behavior normal.        Thought Content: Thought content normal.        Judgment: Judgment normal.      Results for orders placed or performed in visit on 12/02/23  PSA  Result Value Ref Range   PSA 0.37 0.10 - 4.00 ng/mL  Vitamin D (25 hydroxy)  Result Value Ref Range   VITD 23.71 (L) 30.00 - 100.00 ng/mL  TSH  Result Value Ref Range   TSH 0.71 0.35 - 5.50 uIU/mL  Lipid panel  Result Value Ref Range   Cholesterol 177 0 - 200 mg/dL   Triglycerides 24.4 0.0 - 149.0 mg/dL   HDL 01.02 >72.53 mg/dL   VLDL 66.4 0.0 - 40.3 mg/dL   LDL Cholesterol 474 (H) 0 - 99 mg/dL   Total CHOL/HDL Ratio 3    NonHDL 122.09   Comprehensive metabolic panel  Result Value Ref Range   Sodium 141 135 -  145 mEq/L   Potassium 4.4 3.5 - 5.1 mEq/L   Chloride 107 96 - 112 mEq/L   CO2 25 19 - 32 mEq/L   Glucose, Bld 86 70 - 99 mg/dL   BUN 16 6 - 23 mg/dL   Creatinine, Ser 2.59 0.40 - 1.50 mg/dL   Total Bilirubin 0.8 0.2 - 1.2 mg/dL   Alkaline Phosphatase 66 39 - 117 U/L   AST 19 0 - 37 U/L   ALT 21 0 - 53 U/L   Total Protein 6.8 6.0 - 8.3 g/dL   Albumin 4.5 3.5 - 5.2 g/dL   GFR 56.38 >75.64 mL/min   Calcium 9.3 8.4 - 10.5 mg/dL  CBC with Differential/Platelet  Result Value Ref Range   WBC 4.4 4.0 - 10.5 K/uL   RBC 5.52 4.22 - 5.81 Mil/uL   Hemoglobin 16.5 13.0 - 17.0 g/dL   HCT 33.2 95.1 - 88.4 %   MCV 90.5 78.0 - 100.0 fl   MCHC 33.0 30.0 - 36.0 g/dL   RDW 16.6 06.3 - 01.6 %   Platelets 158.0 150.0 - 400.0 K/uL   Neutrophils Relative % 39.4 (L) 43.0 - 77.0 %   Lymphocytes Relative 42.3 12.0 - 46.0 %   Monocytes Relative 11.0 3.0 - 12.0 %   Eosinophils Relative 6.5 (H) 0.0 - 5.0 %   Basophils Relative 0.8 0.0 - 3.0 %   Neutro Abs 1.7 1.4 - 7.7 K/uL   Lymphs Abs 1.8 0.7 - 4.0 K/uL   Monocytes Absolute 0.5 0.1 - 1.0 K/uL   Eosinophils Absolute 0.3 0.0 - 0.7 K/uL   Basophils Absolute 0.0 0.0 - 0.1 K/uL       Assessment & Plan:      There is no immunization history on file for this patient.  Health Maintenance  Topic Date Due   Pneumococcal Vaccine 35-70 Years old (1 of 2 - PCV) Never done   HIV Screening  Never done   Hepatitis C Screening  Never done   DTaP/Tdap/Td (1 - Tdap) Never done   Colonoscopy  Never done   Zoster Vaccines- Shingrix (1 of 2) Never done   COVID-19 Vaccine (1 - 2024-25 season) 12/17/2023 (  Originally 05/29/2023)   INFLUENZA VACCINE  12/26/2023 (Originally 04/28/2023)   HPV VACCINES  Aged Out    Discussed health benefits of physical activity, and encouraged him to engage in regular exercise appropriate for his age and condition.  Annual physical exam Assessment & Plan: Encouraged patient to consume a balanced diet and regular exercise  regimen. Advised to see an eye doctor and dentist annually.  Colonoscopy order placed.   Orders: -     PSA -     VITAMIN D 25 Hydroxy (Vit-D Deficiency, Fractures) -     TSH -     Lipid panel -     Comprehensive metabolic panel -     CBC with Differential/Platelet  Encounter for screening colonoscopy -     Amb Referral to Colonoscopy  Other orders -     Omeprazole; Take 1 capsule (40 mg total) by mouth daily.  Dispense: 90 capsule; Refill: 3    No follow-ups on file.     Kara Dies, NP

## 2023-12-11 DIAGNOSIS — E559 Vitamin D deficiency, unspecified: Secondary | ICD-10-CM | POA: Insufficient documentation

## 2023-12-11 DIAGNOSIS — Z0000 Encounter for general adult medical examination without abnormal findings: Secondary | ICD-10-CM | POA: Insufficient documentation

## 2023-12-11 NOTE — Assessment & Plan Note (Addendum)
 Encouraged patient to consume a balanced diet and regular exercise regimen. Advised to see an eye doctor and dentist annually.  Colonoscopy order placed.

## 2023-12-19 ENCOUNTER — Encounter: Payer: Self-pay | Admitting: *Deleted

## 2024-01-11 DIAGNOSIS — D485 Neoplasm of uncertain behavior of skin: Secondary | ICD-10-CM | POA: Diagnosis not present

## 2024-01-11 DIAGNOSIS — C44619 Basal cell carcinoma of skin of left upper limb, including shoulder: Secondary | ICD-10-CM | POA: Diagnosis not present

## 2024-01-11 DIAGNOSIS — D045 Carcinoma in situ of skin of trunk: Secondary | ICD-10-CM | POA: Diagnosis not present

## 2024-01-11 DIAGNOSIS — D2261 Melanocytic nevi of right upper limb, including shoulder: Secondary | ICD-10-CM | POA: Diagnosis not present

## 2024-01-11 DIAGNOSIS — D2272 Melanocytic nevi of left lower limb, including hip: Secondary | ICD-10-CM | POA: Diagnosis not present

## 2024-01-11 DIAGNOSIS — D225 Melanocytic nevi of trunk: Secondary | ICD-10-CM | POA: Diagnosis not present

## 2024-01-11 DIAGNOSIS — L570 Actinic keratosis: Secondary | ICD-10-CM | POA: Diagnosis not present

## 2024-01-11 DIAGNOSIS — C44519 Basal cell carcinoma of skin of other part of trunk: Secondary | ICD-10-CM | POA: Diagnosis not present

## 2024-01-11 DIAGNOSIS — D2262 Melanocytic nevi of left upper limb, including shoulder: Secondary | ICD-10-CM | POA: Diagnosis not present

## 2024-01-11 DIAGNOSIS — C44719 Basal cell carcinoma of skin of left lower limb, including hip: Secondary | ICD-10-CM | POA: Diagnosis not present

## 2024-02-06 ENCOUNTER — Telehealth

## 2024-02-06 DIAGNOSIS — L03211 Cellulitis of face: Secondary | ICD-10-CM

## 2024-02-06 MED ORDER — CEPHALEXIN 500 MG PO CAPS: 500.0000 mg | ORAL_CAPSULE | Freq: Four times a day (QID) | ORAL | 0 refills | Status: DC

## 2024-02-06 MED ORDER — MUPIROCIN 2 % EX OINT: 1.0000 | TOPICAL_OINTMENT | Freq: Two times a day (BID) | CUTANEOUS | 0 refills | Status: DC

## 2024-02-06 NOTE — Patient Instructions (Signed)
 Sibyl Drafts, thank you for joining Angelia Kelp, PA-C for today's virtual visit.  While this provider is not your primary care provider (PCP), if your PCP is located in our provider database this encounter information will be shared with them immediately following your visit.   A [facility] MyChart account gives you access to today's visit and all your visits, tests, and labs performed at [facility] " click here if you don't have a [facility] MyChart account or go to [email or web address]  Consent: (Patient) Jackson Matthews provided verbal consent for this virtual visit at the beginning of the encounter.  Current Medications:  Current Outpatient Medications:    cephALEXin  (KEFLEX ) 500 MG capsule, Take 1 capsule (500 mg total) by mouth 4 (four) times daily., Disp: 20 capsule, Rfl: 0   mupirocin ointment (BACTROBAN) 2 %, Apply 1 Application topically 2 (two) times daily., Disp: 22 g, Rfl: 0   cetirizine (ZYRTEC) 10 MG tablet, Take 10 mg by mouth daily., Disp: , Rfl:    montelukast  (SINGULAIR ) 10 MG tablet, Take 1 tablet (10 mg total) by mouth at bedtime., Disp: 30 tablet, Rfl: 0   omeprazole  (PRILOSEC) 40 MG capsule, Take 1 capsule (40 mg total) by mouth daily., Disp: 90 capsule, Rfl: 3   Medications ordered in this encounter:  Meds ordered this encounter  Medications   cephALEXin  (KEFLEX ) 500 MG capsule    Sig: Take 1 capsule (500 mg total) by mouth 4 (four) times daily.    Dispense:  20 capsule    Refill:  0    Supervising Provider:   LAMPTEY, PHILIP O [[id number]]   mupirocin ointment (BACTROBAN) 2 %    Sig: Apply 1 Application topically 2 (two) times daily.    Dispense:  22 g    Refill:  0    Supervising Provider:   Corine Dice [[id number]]     *If you need refills on other medications prior to your next appointment, please contact your pharmacy*  Follow-Up: Call back or seek an in-person evaluation if the symptoms worsen or if the condition fails  to improve as anticipated.  [facility] [phone]  Other Instructions Skin Abscess  A skin abscess is an infected area on or under your skin. It contains pus and other material. An abscess may also be called a furuncle, carbuncle, or boil. It is often the result of an infection caused by bacteria. An abscess can occur in or on almost any part of your body. Sometimes, an abscess may break open (rupture) on its own. In most cases, it will keep getting worse unless it is treated. An abscess can cause pain and make you feel ill. An untreated abscess can cause infection to spread to other parts of your body or your bloodstream. The abscess may need to be drained. You may also need to take antibiotics. What are the causes? An abscess occurs when germs, like bacteria, pass through your skin and cause an infection. This may be caused by: A scrape or cut on your skin. A puncture wound through your skin, such as a needle injection or insect bite. Blocked oil or sweat glands. Blocked and infected hair follicles. A fluid-filled sac that forms beneath your skin (sebaceous cyst) and becomes infected. What increases the risk? You may be more likely to develop an abscess if: You have problems with blood circulation, or you have a weak body defense system (immune system). You have diabetes. You have dry and irritated  skin. You get injections often or use IV drugs. You have a foreign body in a wound, such as a splinter. You smoke or use tobacco products. What are the signs or symptoms? Symptoms of this condition include: A painful, firm bump under the skin. A bump with pus at the top. This may break through the skin and drain. Other symptoms include: Redness and swelling around the abscess. Warmth or tenderness. Swelling of the lymph nodes (glands) near the abscess. A sore on the skin. How is this diagnosed? This condition may be diagnosed based on a physical exam and your medical  history. You may also have tests done, such as: A test of a sample of pus. This may be done to find what is causing the infection. Blood tests. Imaging tests, such as an ultrasound, CT scan, or MRI. How is this treated? A small abscess that drains on its own may not need to be treated. Treatment for larger abscesses may include: Moist heat or a heat pack applied to the area a few times a day. Incision and drainage. This is a procedure to drain the abscess. Antibiotics. For a severe abscess, you may first get antibiotics through an IV and then change to antibiotics by mouth. Follow these instructions at home: Medicines Take over-the-counter and prescription medicines only as told by your provider. If you were prescribed antibiotics, take them as told by your provider. Do not stop using the antibiotic even if you start to feel better. Abscess care  If you have an abscess that has not drained, apply heat to the affected area. Use the heat source that your provider recommends, such as a moist heat pack or a heating pad. Place a towel between your skin and the heat source. Leave the heat on for 20-30 minutes at a time. If your skin turns bright red, remove the heat right away to prevent burns. The risk of burns is higher if you cannot feel pain, heat, or cold. Follow instructions from your provider about how to take care of your abscess. Make sure you: Cover the abscess with a bandage (dressing). Wash your hands with soap and water for at least 20 seconds before and after you change the dressing or gauze. If soap and water are not available, use hand sanitizer. Change your dressing or gauze as told by your provider. Check your abscess every day for signs of an infection that is getting worse. Check for: More redness, swelling, pain, or tenderness. More fluid or blood. Warmth. More pus or a worse smell. General instructions To avoid spreading the infection: Do not share personal care items,  towels, or hot tubs with others. Avoid making skin contact with other people. Be careful when getting rid of used dressings, wound packing, or any drainage from the abscess. Do not use any products that contain nicotine or tobacco. These products include cigarettes, chewing tobacco, and vaping devices, such as e-cigarettes. If you need help quitting, ask your provider. Do not use any creams, ointments, or liquids unless you have been told to by your provider. Contact a health care provider if: You see redness that spreads quickly or red streaks on your skin spreading away from the abscess. You have any signs of worse infection at the abscess. You vomit every time you eat or drink. You have a fever, chills, or muscle aches. The cyst or abscess returns. Get help right away if: You have severe pain. You make less pee (urine) than normal. This information is not  intended to replace advice given to you by your health care provider. Make sure you discuss any questions you have with your health care provider. Document Revised: 04/28/2022 Document Reviewed: 04/28/2022 Elsevier Patient Education  2024 Elsevier Inc.   If you have been instructed to have an in-person evaluation today at a local Urgent Care facility, please use the link below. It will take you to a list of all of our available [facility], including address, phone number and hours of operation. Please do not delay care.  [facility]  If you or a family member do not have a primary care provider, use the link below to schedule a visit and establish care. When you choose a [facility] primary care physician or advanced practice provider, you gain a long-term partner in health. Find a Primary Care Provider  Learn more about [facility]'s in-office and virtual care options: [facility] - Get Care Now

## 2024-02-06 NOTE — Progress Notes (Signed)
 Virtual Visit Consent   Jackson Matthews, you are scheduled for a virtual visit with a [facility] provider today. Just as with appointments in the office, your consent must be obtained to participate. Your consent will be active for this visit and any virtual visit you may have with one of our providers in the next 365 days. If you have a MyChart account, a copy of this consent can be sent to you electronically.  As this is a virtual visit, video technology does not allow for your provider to perform a traditional examination. This may limit your provider's ability to fully assess your condition. If your provider identifies any concerns that need to be evaluated in person or the need to arrange testing (such as labs, EKG, etc.), we will make arrangements to do so. Although advances in technology are sophisticated, we cannot ensure that it will always work on either your end or our end. If the connection with a video visit is poor, the visit may have to be switched to a telephone visit. With either a video or telephone visit, we are not always able to ensure that we have a secure connection.  By engaging in this virtual visit, you consent to the provision of healthcare and authorize for your insurance to be billed (if applicable) for the services provided during this visit. Depending on your insurance coverage, you may receive a charge related to this service.  I need to obtain your verbal consent now. Are you willing to proceed with your visit today? [name] has provided verbal consent on 02/06/2024 for a virtual visit (video or telephone). Angelia Kelp, PA-C  Date: 02/06/2024 8:17 AM   Virtual Visit via Video Note   I, Angelia Kelp, connected with  Jackson Matthews  ([id number], November 11, 1965) on 02/06/24 at  8:15 AM EDT by a video-enabled telemedicine application and verified that I am speaking with the correct person using two identifiers.  Location: Patient: Virtual Visit  Location Patient: Mobile Provider: Virtual Visit Location Provider: Home Office   I discussed the limitations of evaluation and management by telemedicine and the availability of in person appointments. The patient expressed understanding and agreed to proceed.    History of Present Illness: CHESKY RESPRESS is a 58 y.o. who identifies as a male who was assigned male at birth, and is being seen today for a bump on his face under his nose that has become more swollen and painful. Reports it started 4 days ago as a small pimple. He tried to pop it and it spread into surrounding tissue. The opening became larger and the surrounding tissue started to swell and become tender to touch.  He denies any fevers, sweats, nausea, or vomiting. He does endorse mild chills last night.   Problems:  Patient Active Problem List   Diagnosis Date Noted   Annual physical exam 12/11/2023   Vitamin D  deficiency 12/11/2023   Subacute pansinusitis 11/13/2023   Other fatigue 01/06/2023   Seasonal allergies 01/06/2023   Cough variant asthma 08/26/2020   History of COVID-19 08/26/2020    Allergies: No Known Allergies Medications:  Current Outpatient Medications:    cephALEXin  (KEFLEX ) 500 MG capsule, Take 1 capsule (500 mg total) by mouth 4 (four) times daily., Disp: 20 capsule, Rfl: 0   mupirocin ointment (BACTROBAN) 2 %, Apply 1 Application topically 2 (two) times daily., Disp: 22 g, Rfl: 0   cetirizine (ZYRTEC) 10 MG tablet, Take 10 mg by mouth daily., Disp: ,  Rfl:    montelukast  (SINGULAIR ) 10 MG tablet, Take 1 tablet (10 mg total) by mouth at bedtime., Disp: 30 tablet, Rfl: 0   omeprazole  (PRILOSEC) 40 MG capsule, Take 1 capsule (40 mg total) by mouth daily., Disp: 90 capsule, Rfl: 3  Observations/Objective: Patient is well-developed, well-nourished in no acute distress.  Resting comfortably at home.  Head is normocephalic, atraumatic.  No labored breathing.  Speech is clear and coherent with logical  content.  Patient is alert and oriented at baseline.  Lesion noted on the left philtrum column with dry eschar covering and surrounded redness and swelling  Assessment and Plan: 1. Facial cellulitis (Primary) - cephALEXin  (KEFLEX ) 500 MG capsule; Take 1 capsule (500 mg total) by mouth 4 (four) times daily.  Dispense: 20 capsule; Refill: 0 - mupirocin ointment (BACTROBAN) 2 %; Apply 1 Application topically 2 (two) times daily.  Dispense: 22 g; Refill: 0  - Keflex  prescribed - Topical Mupirocin ointment prescribed - Keep clean and dry - Avoid picking - Warm compresses - Tylenol  and/or Ibuprofen as needed - Seek in person evaluation if worsening or fails to improve  Follow Up Instructions: I discussed the assessment and treatment plan with the patient. The patient was provided an opportunity to ask questions and all were answered. The patient agreed with the plan and demonstrated an understanding of the instructions.  A copy of instructions were sent to the patient via MyChart unless otherwise noted below.    The patient was advised to call back or seek an in-person evaluation if the symptoms worsen or if the condition fails to improve as anticipated.    Angelia Kelp, PA-C

## 2024-05-21 DIAGNOSIS — L237 Allergic contact dermatitis due to plants, except food: Secondary | ICD-10-CM | POA: Diagnosis not present

## 2024-07-18 DIAGNOSIS — D485 Neoplasm of uncertain behavior of skin: Secondary | ICD-10-CM | POA: Diagnosis not present

## 2024-07-18 DIAGNOSIS — D2262 Melanocytic nevi of left upper limb, including shoulder: Secondary | ICD-10-CM | POA: Diagnosis not present

## 2024-07-18 DIAGNOSIS — D2261 Melanocytic nevi of right upper limb, including shoulder: Secondary | ICD-10-CM | POA: Diagnosis not present

## 2024-07-18 DIAGNOSIS — D225 Melanocytic nevi of trunk: Secondary | ICD-10-CM | POA: Diagnosis not present

## 2024-07-18 DIAGNOSIS — D2272 Melanocytic nevi of left lower limb, including hip: Secondary | ICD-10-CM | POA: Diagnosis not present

## 2024-07-18 DIAGNOSIS — C44719 Basal cell carcinoma of skin of left lower limb, including hip: Secondary | ICD-10-CM | POA: Diagnosis not present

## 2024-07-18 DIAGNOSIS — C44619 Basal cell carcinoma of skin of left upper limb, including shoulder: Secondary | ICD-10-CM | POA: Diagnosis not present

## 2024-07-18 DIAGNOSIS — C44519 Basal cell carcinoma of skin of other part of trunk: Secondary | ICD-10-CM | POA: Diagnosis not present

## 2024-07-18 DIAGNOSIS — L570 Actinic keratosis: Secondary | ICD-10-CM | POA: Diagnosis not present

## 2024-08-27 ENCOUNTER — Telehealth: Admitting: Physician Assistant

## 2024-08-27 DIAGNOSIS — B9689 Other specified bacterial agents as the cause of diseases classified elsewhere: Secondary | ICD-10-CM

## 2024-08-27 DIAGNOSIS — J019 Acute sinusitis, unspecified: Secondary | ICD-10-CM | POA: Diagnosis not present

## 2024-08-27 MED ORDER — AMOXICILLIN-POT CLAVULANATE 875-125 MG PO TABS: 1.0000 | ORAL_TABLET | Freq: Two times a day (BID) | ORAL | 0 refills | Status: AC

## 2024-08-27 NOTE — Patient Instructions (Signed)
  [name], thank you for joining [employer], PA-C for today's virtual visit.  While this provider is not your primary care provider (PCP), if your PCP is located in our provider database this encounter information will be shared with them immediately following your visit.   A [facility] MyChart account gives you access to today's visit and all your visits, tests, and labs performed at [facility]  click here if you don't have a  city  MyChart account or go to [email or web address]  Consent: (Patient) Johan R Weatherly provided verbal consent for this virtual visit at the beginning of the encounter.  Current Medications:  Current Outpatient Medications:    amoxicillin -clavulanate (AUGMENTIN ) 875-125 MG tablet, Take 1 tablet by mouth 2 (two) times daily for 7 days., Disp: 14 tablet, Rfl: 0   cetirizine (ZYRTEC) 10 MG tablet, Take 10 mg by mouth daily., Disp: , Rfl:    montelukast  (SINGULAIR ) 10 MG tablet, Take 1 tablet (10 mg total) by mouth at bedtime., Disp: 30 tablet, Rfl: 0   mupirocin  ointment (BACTROBAN ) 2 %, Apply 1 Application topically 2 (two) times daily., Disp: 22 g, Rfl: 0   omeprazole  (PRILOSEC) 40 MG capsule, Take 1 capsule (40 mg total) by mouth daily., Disp: 90 capsule, Rfl: 3   Medications ordered in this encounter:  Meds ordered this encounter  Medications   amoxicillin -clavulanate (AUGMENTIN ) 875-125 MG tablet    Sig: Take 1 tablet by mouth 2 (two) times daily for 7 days.    Dispense:  14 tablet    Refill:  0    Supervising Provider:   BLAISE ALEENE KIDD [[id number]]     *If you need refills on other medications prior to your next appointment, please contact your pharmacy*  Follow-Up: Call back or seek an in-person evaluation if the symptoms worsen or if the condition fails to improve as anticipated.  [facility] [phone]  Other Instructions Take antibiotic as prescribed.  Recommend Flonase and Mucinex for congestion  and postnasal drip.  Drink plenty of fluids and rest.  If no improvement or symptoms become worse recommend an in person evaluation.    If you have been instructed to have an in-person evaluation today at a local Urgent Care facility, please use the link below. It will take you to a list of all of our available [facility], including address, phone number and hours of operation. Please do not delay care.  [place] [facility]  If you or a family member do not have a primary care provider, use the link below to schedule a visit and establish care. When you choose a [facility] primary care physician or advanced practice provider, you gain a long-term partner in health. Find a Primary Care Provider  Learn more about [facility]'s in-office and virtual care options: [facility] - Get Care Now

## 2024-08-27 NOTE — Progress Notes (Signed)
 Virtual Visit Consent   Jackson Matthews, you are scheduled for a virtual visit with a [facility] provider today. Just as with appointments in the office, your consent must be obtained to participate. Your consent will be active for this visit and any virtual visit you may have with one of our providers in the next 365 days. If you have a MyChart account, a copy of this consent can be sent to you electronically.  As this is a virtual visit, video technology does not allow for your provider to perform a traditional examination. This may limit your provider's ability to fully assess your condition. If your provider identifies any concerns that need to be evaluated in person or the need to arrange testing (such as labs, EKG, etc.), we will make arrangements to do so. Although advances in technology are sophisticated, we cannot ensure that it will always work on either your end or our end. If the connection with a video visit is poor, the visit may have to be switched to a telephone visit. With either a video or telephone visit, we are not always able to ensure that we have a secure connection.  By engaging in this virtual visit, you consent to the provision of healthcare and authorize for your insurance to be billed (if applicable) for the services provided during this visit. Depending on your insurance coverage, you may receive a charge related to this service.  I need to obtain your verbal consent now. Are you willing to proceed with your visit today? Jackson Matthews has provided verbal consent on 08/27/2024 for a virtual visit (video or telephone). Harlene PEDLAR Ward, PA-C  Date: 08/27/2024 7:16 PM   Virtual Visit via Video Note   I, Harlene PEDLAR Ward, connected with  Jackson Matthews  ([id number], 1966-05-19) on 08/27/24 at  7:15 PM EST by a video-enabled telemedicine application and verified that I am speaking with the correct person using two identifiers.  Location: Patient: Virtual Visit Location  Patient: Home Provider: Virtual Visit Location Provider: Home Office   I discussed the limitations of evaluation and management by telemedicine and the availability of in person appointments. The patient expressed understanding and agreed to proceed.    History of Present Illness: Jackson Matthews is a 58 y.o. who identifies as a male who was assigned male at birth, and is being seen today for sinus pressure and pain, congestion, postnasal drip that started about 3 weeks ago with minimal improvement.  Denies fever, chills.  He does report cough.  Denies wheezing or shortness of breath.  HPI: HPI  Problems:  Patient Active Problem List   Diagnosis Date Noted   Annual physical exam 12/11/2023   Vitamin D  deficiency 12/11/2023   Subacute pansinusitis 11/13/2023   Other fatigue 01/06/2023   Seasonal allergies 01/06/2023   Cough variant asthma 08/26/2020   History of COVID-19 08/26/2020    Allergies: No Known Allergies Medications:  Current Outpatient Medications:    amoxicillin -clavulanate (AUGMENTIN ) 875-125 MG tablet, Take 1 tablet by mouth 2 (two) times daily for 7 days., Disp: 14 tablet, Rfl: 0   cetirizine (ZYRTEC) 10 MG tablet, Take 10 mg by mouth daily., Disp: , Rfl:    montelukast  (SINGULAIR ) 10 MG tablet, Take 1 tablet (10 mg total) by mouth at bedtime., Disp: 30 tablet, Rfl: 0   mupirocin  ointment (BACTROBAN ) 2 %, Apply 1 Application topically 2 (two) times daily., Disp: 22 g, Rfl: 0   omeprazole  (PRILOSEC) 40 MG capsule, Take 1  capsule (40 mg total) by mouth daily., Disp: 90 capsule, Rfl: 3  Observations/Objective: Patient is well-developed, well-nourished in no acute distress.  Resting comfortably  at home.  Head is normocephalic, atraumatic.  No labored breathing.  Speech is clear and coherent with logical content.  Patient is alert and oriented at baseline.    Assessment and Plan: 1. Acute bacterial sinusitis (Primary)  Antibiotic prescribed.  Supportive care  discussed.  In person evaluation precautions discussed .  Follow Up Instructions: I discussed the assessment and treatment plan with the patient. The patient was provided an opportunity to ask questions and all were answered. The patient agreed with the plan and demonstrated an understanding of the instructions.  A copy of instructions were sent to the patient via MyChart unless otherwise noted below.     The patient was advised to call back or seek an in-person evaluation if the symptoms worsen or if the condition fails to improve as anticipated.    Harlene PEDLAR Ward, PA-C

## 2024-08-28 ENCOUNTER — Telehealth: Admitting: Family Medicine

## 2024-08-28 ENCOUNTER — Ambulatory Visit (INDEPENDENT_AMBULATORY_CARE_PROVIDER_SITE_OTHER): Admitting: Nurse Practitioner

## 2024-08-28 ENCOUNTER — Encounter: Payer: Self-pay | Admitting: Nurse Practitioner

## 2024-08-28 ENCOUNTER — Encounter: Payer: Self-pay | Admitting: Family Medicine

## 2024-08-28 VITALS — BP 124/80 | HR 77 | Temp 98.4°F | Ht 72.0 in | Wt 236.6 lb

## 2024-08-28 DIAGNOSIS — L089 Local infection of the skin and subcutaneous tissue, unspecified: Secondary | ICD-10-CM

## 2024-08-28 NOTE — Progress Notes (Signed)
 Established Patient Office Visit  Subjective:  Patient ID: Jackson Matthews, male    DOB: [birth year]  Age: 58 y.o. MRN: [id number]  CC:  Chief Complaint  Patient presents with   Acute Visit    Knot on right side of neck -feels Hard x 3 days Pain in neck and back of head Pain 4/10  when touching pain 10/10    Discussed the use of AI scribe software for clinical note transcription with the patient, who gave verbal consent to proceed.  History of Present Illness  History of Present Illness   Jackson Matthews is a 58 year old male who presents with a painful bump on the right side of his posterior neck.  A painful bump on the right side of his neck began about three days ago. It was initially small and non-tender, then enlarged and became painful after he squeezed it. It is now more painful with touch or when clothing rubs it, with pain radiating to his head when he bends or twists his neck.  He is applying Bactroban  ointment and started Augmentin  today for sinusitis with one dose taken. He had virtual visit yesterday for sinusitis.   He has had similar bumps in the past, most recently six to eight months ago, without a known trigger.   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;  Surgeon: Ozell Flake, MD;  Location: [facility] OR[facility];  Service: Orthopedics;  Laterality: Right;   FRONTAL SINUS EXPLORATION Bilateral 04/19/2017   Procedure: FRONTAL SINUS EXPLORATION;  Surgeon: Blair Mt, MD;  Location: [facility];  Service: ENT;  Laterality: Bilateral;   IMAGE GUIDED SINUS  SURGERY N/A 04/19/2017   Procedure: IMAGE GUIDED SINUS SURGERY;  Surgeon: Blair Mt, MD;  Location: [facility];  Service: ENT;  Laterality: N/A;    LEFT SPHENOIDECTOMY NOT PERFORMED POLYPS WERE BLOCKING THE OPENING AND WHEN POLYPS WERE REMOVED THE SINUS CAVITY WAS OPEN   NEED DISK GAVE DISK TO CECE 6-6- KP   MAXILLARY ANTROSTOMY Bilateral 04/19/2017   Procedure: MAXILLARY ANTROSTOMY;  Surgeon: Blair Mt, MD;  Location: [facility];  Service: ENT;  Laterality: Bilateral;   POLYPECTOMY Left 04/19/2017   Procedure: POLYPECTOMY NASAL ENDOSCOPY LEFT;  Surgeon: Blair Mt, MD;  Location: [facility];  Service: ENT;  Laterality: Left;   SEPTOPLASTY N/A 05/19/2017   Procedure: POST OP SINUS BLEED;  Surgeon: Milissa Hamming, MD;  Location: [facility] OR[facility];  Service: ENT;  Laterality: N/A;   TURBINATE REDUCTION Bilateral 04/19/2017   Procedure: MONTA COBLATION;  Surgeon: Blair Mt, MD;  Location: [facility];  Service: ENT;  Laterality: Bilateral;    Family History  Problem Relation Age of Onset   Healthy Mother    Cancer Father        lung cancer   Stroke Father     Social History   Socioeconomic History   Marital status: Married    Spouse name: Not on file   Number of children: Not on file   Years of education: Not on file   Highest education level: Not on file  Occupational  History   Not on file  Tobacco Use   Smoking status: Never   Smokeless tobacco: Never  Vaping Use   Vaping status: Never Used  Substance and Sexual Activity   Alcohol use: Not Currently    Comment: occ., 3-4x/mo   Drug use: No   Sexual activity: Yes    Partners: Female  Other Topics Concern   Not on file  Social History Narrative   He works at [employer]. Lives with wife and 1 son.    Social Drivers of Corporate Investment Banker Strain: Not on file  Food Insecurity: Not on file  Transportation Needs: Not on file  Physical Activity: Not on file  Stress: Not  on file  Social Connections: Not on file  Intimate Partner Violence: Not on file     Outpatient Medications Prior to Visit  Medication Sig Dispense Refill   amoxicillin -clavulanate (AUGMENTIN ) 875-125 MG tablet Take 1 tablet by mouth 2 (two) times daily for 7 days. 14 tablet 0   cetirizine (ZYRTEC) 10 MG tablet Take 10 mg by mouth daily.     montelukast  (SINGULAIR ) 10 MG tablet Take 1 tablet (10 mg total) by mouth at bedtime. 30 tablet 0   mupirocin  ointment (BACTROBAN ) 2 % Apply 1 Application topically 2 (two) times daily. 22 g 0   omeprazole  (PRILOSEC) 40 MG capsule Take 1 capsule (40 mg total) by mouth daily. 90 capsule 3   No facility-administered medications prior to visit.    No Known Allergies  ROS Review of Systems Negative unless indicated in HPI.    Objective:    Physical Exam Constitutional:      Appearance: Normal appearance.  Neck:      Comments: Erythematous papule with central pustule. Tenderness to palpation. Cardiovascular:     Rate and Rhythm: Normal rate and regular rhythm.     Pulses: Normal pulses.     Heart sounds: Normal heart sounds.  Pulmonary:     Effort: Pulmonary effort is normal.     Breath sounds: Normal breath sounds.  Musculoskeletal:     Cervical back: Normal range of motion. No tenderness.  Neurological:     Mental Status: He is alert and oriented to person, place, and time. Mental status is at baseline.  Psychiatric:        Mood and Affect: Mood normal.        Behavior: Behavior normal.        Judgment: Judgment normal.     BP 124/80   Pulse 77   Temp 98.4 F (36.9 C)   Ht 6' (1.829 m)   Wt 236 lb 9.6 oz (107.3 kg)   SpO2 97%   BMI 32.09 kg/m  Wt Readings from Last 3 Encounters:  08/28/24 236 lb 9.6 oz (107.3 kg)  12/02/23 239 lb 9.6 oz (108.7 kg)  11/04/23 236 lb 9.6 oz (107.3 kg)     Health Maintenance  Topic Date Due   HIV Screening  Never done   Hepatitis C Screening  Never done   DTaP/Tdap/Td (1 - Tdap)  Never done   Pneumococcal Vaccine: 50+ Years (1 of 2 - PCV) Never done   Hepatitis B Vaccines 19-59 Average Risk (1 of 3 - 19+ 3-dose series) Never done   Colonoscopy  Never done   Zoster Vaccines- Shingrix (1 of 2) Never done   COVID-19 Vaccine (1 - 2025-26 season) Never done   Influenza Vaccine  12/25/2024 (Originally 04/27/2024)   HPV VACCINES  Aged Out  Meningococcal B Vaccine  Aged Out       Topic Date Due   Hepatitis B Vaccines 19-59 Average Risk (1 of 3 - 19+ 3-dose series) Never done    Lab Results  Component Value Date   TSH 0.71 12/02/2023   Lab Results  Component Value Date   WBC 4.4 12/02/2023   HGB 16.5 12/02/2023   HCT 49.9 12/02/2023   MCV 90.5 12/02/2023   PLT 158.0 12/02/2023   Lab Results  Component Value Date   NA 141 12/02/2023   K 4.4 12/02/2023   CO2 25 12/02/2023   GLUCOSE 86 12/02/2023   BUN 16 12/02/2023   CREATININE 0.97 12/02/2023   BILITOT 0.8 12/02/2023   ALKPHOS 66 12/02/2023   AST 19 12/02/2023   ALT 21 12/02/2023   PROT 6.8 12/02/2023   ALBUMIN 4.5 12/02/2023   CALCIUM 9.3 12/02/2023   ANIONGAP 11 07/21/2020   GFR 87.60 12/02/2023   Lab Results  Component Value Date   CHOL 177 12/02/2023   Lab Results  Component Value Date   HDL 54.60 12/02/2023   Lab Results  Component Value Date   LDLCALC 111 (H) 12/02/2023   Lab Results  Component Value Date   TRIG 54.0 12/02/2023   Lab Results  Component Value Date   CHOLHDL 3 12/02/2023   No results found for: HGBA1C    Assessment & Plan:   Assessment & Plan Local skin infection Acute infection on the right posterior neck, tender with pus formation. - - - -Continue Augmentin  twice daily. - Apply Bactroban  ointment to affected area. - Use warm compresses to aid drainage. - Avoid pressing or squeezing area. - Follow-up on Friday to assess progres     Assessment & Plan    Follow-up: Return in about 3 days (around 08/31/2024).   Gabreal Worton, NP

## 2024-08-28 NOTE — Progress Notes (Signed)
 The patient no-showed for appointment despite this provider sending direct link, reaching out via phone with no response and waiting for at least 10 minutes from appointment time for patient to join. They will be marked as a NS for this appointment/time.   Freddy Finner, NP

## 2024-08-31 ENCOUNTER — Encounter: Payer: Self-pay | Admitting: Nurse Practitioner

## 2024-08-31 ENCOUNTER — Ambulatory Visit: Admitting: Nurse Practitioner

## 2024-08-31 DIAGNOSIS — L03211 Cellulitis of face: Secondary | ICD-10-CM

## 2024-08-31 DIAGNOSIS — L089 Local infection of the skin and subcutaneous tissue, unspecified: Secondary | ICD-10-CM | POA: Diagnosis not present

## 2024-08-31 MED ORDER — MUPIROCIN 2 % EX OINT: 1.0000 | TOPICAL_OINTMENT | Freq: Two times a day (BID) | CUTANEOUS | 0 refills | Status: AC

## 2024-08-31 NOTE — Progress Notes (Unsigned)
 Established Patient Office Visit  Subjective:  Patient ID: Jackson Matthews, male    DOB: July 28, 1966  Age: 58 y.o. MRN: [id number]  CC:  Chief Complaint  Patient presents with   Acute Visit    3 day follow up on local skin infection Patient states he feels better   Discussed the use of AI scribe software for clinical note transcription with the patient, who gave verbal consent to proceed.  History of Present Illness   History of Present Illness   Jackson Matthews is a 58 year old male who presents for follow-up on skin lesion.  The lesion is still oozing but is less tender since starting treatment. He started Augmentin  on Tuesday and uses Bactroban  ointment, which he ran out of this morning, and has four days of Augmentin  remaining. He has no fever, chills, or antibiotic side effects.  He keeps the lesion covered with a bandage at night and while working and leaves it open to air at home. He has not tried to manually express it, though it sometimes drains down his neck. Warm compresses increase the drainage.     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;  Surgeon: Ozell Flake, MD;  Location: [facility] OR[facility];  Service: Orthopedics;  Laterality: Right;   FRONTAL SINUS EXPLORATION Bilateral 04/19/2017   Procedure: FRONTAL SINUS EXPLORATION;  Surgeon: Blair Mt, MD;  Location: [facility];  Service: ENT;  Laterality: Bilateral;   IMAGE GUIDED SINUS SURGERY N/A 04/19/2017   Procedure: IMAGE GUIDED SINUS SURGERY;  Surgeon: Blair Mt, MD;  Location: [facility];   Service: ENT;  Laterality: N/A;    LEFT SPHENOIDECTOMY NOT PERFORMED POLYPS WERE BLOCKING THE OPENING AND WHEN POLYPS WERE REMOVED THE SINUS CAVITY WAS OPEN   NEED DISK GAVE DISK TO CECE 6-6- KP   MAXILLARY ANTROSTOMY Bilateral 04/19/2017   Procedure: MAXILLARY ANTROSTOMY;  Surgeon: Blair Mt, MD;  Location: [facility];  Service: ENT;  Laterality: Bilateral;   POLYPECTOMY Left 04/19/2017   Procedure: POLYPECTOMY NASAL ENDOSCOPY LEFT;  Surgeon: Blair Mt, MD;  Location: [facility];  Service: ENT;  Laterality: Left;   SEPTOPLASTY N/A 05/19/2017   Procedure: POST OP SINUS BLEED;  Surgeon: Milissa Hamming, MD;  Location: [facility] OR[facility];  Service: ENT;  Laterality: N/A;   TURBINATE REDUCTION Bilateral 04/19/2017   Procedure: MONTA COBLATION;  Surgeon: Blair Mt, MD;  Location: [facility];  Service: ENT;  Laterality: Bilateral;    Family History  Problem Relation Age of Onset   Healthy Mother    Cancer Father        lung cancer   Stroke Father     Social History   Socioeconomic History   Marital status: Married    Spouse name: Not on file   Number of children: Not on file   Years of education: Not on file   Highest education level: Not on file  Occupational History   Not on file  Tobacco Use   Smoking status: Never   Smokeless tobacco: Never  Vaping Use   Vaping status: Never Used  Substance  and Sexual Activity   Alcohol use: Not Currently    Comment: occ., 3-4x/mo   Drug use: No   Sexual activity: Yes    Partners: Female  Other Topics Concern   Not on file  Social History Narrative   He works at [employer]. Lives with wife and 1 son.    Social Drivers of Corporate Investment Banker Strain: Not on file  Food Insecurity: Not on file  Transportation Needs: Not on file  Physical Activity: Not on file  Stress: Not on file  Social Connections: Not on file  Intimate Partner Violence: Not on file     Outpatient Medications Prior to  Visit  Medication Sig Dispense Refill   amoxicillin -clavulanate (AUGMENTIN ) 875-125 MG tablet Take 1 tablet by mouth 2 (two) times daily for 7 days. 14 tablet 0   cetirizine (ZYRTEC) 10 MG tablet Take 10 mg by mouth daily.     montelukast  (SINGULAIR ) 10 MG tablet Take 1 tablet (10 mg total) by mouth at bedtime. 30 tablet 0   omeprazole  (PRILOSEC) 40 MG capsule Take 1 capsule (40 mg total) by mouth daily. 90 capsule 3   mupirocin  ointment (BACTROBAN ) 2 % Apply 1 Application topically 2 (two) times daily. 22 g 0   No facility-administered medications prior to visit.    No Known Allergies  ROS Review of Systems Negative unless indicated in HPI.    Objective:    Physical Exam Constitutional:      Appearance: Normal appearance.  Cardiovascular:     Rate and Rhythm: Normal rate and regular rhythm.     Heart sounds: No murmur heard. Pulmonary:     Effort: Pulmonary effort is normal.     Breath sounds: Normal breath sounds. No wheezing.  Skin:    General: Skin is warm.     Findings: Lesion present.     Comments: Small erythematous tender lesion with clear drainage to right posterior neck.  Neurological:     Mental Status: He is alert.     BP 116/74   Pulse 78   Temp 98.6 F (37 C)   Ht 6' (1.829 m)   Wt 240 lb 9.6 oz (109.1 kg)   SpO2 94%   BMI 32.63 kg/m  Wt Readings from Last 3 Encounters:  08/31/24 240 lb 9.6 oz (109.1 kg)  08/28/24 236 lb 9.6 oz (107.3 kg)  12/02/23 239 lb 9.6 oz (108.7 kg)     Health Maintenance  Topic Date Due   HIV Screening  Never done   Hepatitis C Screening  Never done   DTaP/Tdap/Td (1 - Tdap) Never done   Pneumococcal Vaccine: 50+ Years (1 of 2 - PCV) Never done   Hepatitis B Vaccines 19-59 Average Risk (1 of 3 - 19+ 3-dose series) Never done   Colonoscopy  Never done   Zoster Vaccines- Shingrix (1 of 2) Never done   COVID-19 Vaccine (1 - 2025-26 season) Never done   Influenza Vaccine  12/25/2024 (Originally 04/27/2024)   HPV  VACCINES  Aged Out   Meningococcal B Vaccine  Aged Out       Topic Date Due   Hepatitis B Vaccines 19-59 Average Risk (1 of 3 - 19+ 3-dose series) Never done    Lab Results  Component Value Date   TSH 0.71 12/02/2023   Lab Results  Component Value Date   WBC 4.4 12/02/2023   HGB 16.5 12/02/2023   HCT 49.9 12/02/2023   MCV 90.5 12/02/2023   PLT  158.0 12/02/2023   Lab Results  Component Value Date   NA 141 12/02/2023   K 4.4 12/02/2023   CO2 25 12/02/2023   GLUCOSE 86 12/02/2023   BUN 16 12/02/2023   CREATININE 0.97 12/02/2023   BILITOT 0.8 12/02/2023   ALKPHOS 66 12/02/2023   AST 19 12/02/2023   ALT 21 12/02/2023   PROT 6.8 12/02/2023   ALBUMIN 4.5 12/02/2023   CALCIUM 9.3 12/02/2023   ANIONGAP 11 07/21/2020   GFR 87.60 12/02/2023   Lab Results  Component Value Date   CHOL 177 12/02/2023   Lab Results  Component Value Date   HDL 54.60 12/02/2023   Lab Results  Component Value Date   LDLCALC 111 (H) 12/02/2023   Lab Results  Component Value Date   TRIG 54.0 12/02/2023   Lab Results  Component Value Date   CHOLHDL 3 12/02/2023   No results found for: HGBA1C    Assessment & Plan:   Assessment & Plan Local skin infection Improving with reduced tenderness, no fever or chills. Oozing persists but less severe. No side effects from Augmentin . - Continue Augmentin  for 4 more days. - Refilled Bactroban  ointment. - Scheduled follow-up in one week. - Advised to report if no improvement for potential antibiotic dose adjustment.   Orders:   mupirocin  ointment (BACTROBAN ) 2 %; Apply 1 Application topically 2 (two) times daily.   Assessment & Plan       Follow-up: Return in about 1 week (around 09/07/2024).   Xaniyah Buchholz, NP

## 2024-09-05 ENCOUNTER — Encounter: Payer: Self-pay | Admitting: Nurse Practitioner

## 2024-09-05 DIAGNOSIS — L089 Local infection of the skin and subcutaneous tissue, unspecified: Secondary | ICD-10-CM | POA: Insufficient documentation

## 2024-09-05 NOTE — Assessment & Plan Note (Signed)
 Improving with reduced tenderness, no fever or chills. Oozing persists but less severe. No side effects from Augmentin . - Continue Augmentin  for 4 more days. - Refilled Bactroban  ointment. - Scheduled follow-up in one week. - Advised to report if no improvement for potential antibiotic dose adjustment.   Orders:   mupirocin  ointment (BACTROBAN ) 2 %; Apply 1 Application topically 2 (two) times daily.

## 2024-09-07 ENCOUNTER — Ambulatory Visit: Admitting: Nurse Practitioner

## 2024-10-16 ENCOUNTER — Ambulatory Visit: Admitting: Nurse Practitioner
